# Patient Record
Sex: Female | Born: 1960 | Race: White | Hispanic: No | Marital: Married | Smoking: Never smoker
Health system: Southern US, Community
[De-identification: ages and names within clinical notes are randomized; demographics above are authoritative.]

## PROBLEM LIST (undated history)

## (undated) DIAGNOSIS — F419 Anxiety disorder, unspecified: Secondary | ICD-10-CM

## (undated) DIAGNOSIS — E669 Obesity, unspecified: Secondary | ICD-10-CM

## (undated) DIAGNOSIS — I10 Essential (primary) hypertension: Secondary | ICD-10-CM

## (undated) DIAGNOSIS — R0683 Snoring: Secondary | ICD-10-CM

## (undated) DIAGNOSIS — N200 Calculus of kidney: Secondary | ICD-10-CM

## (undated) DIAGNOSIS — I471 Supraventricular tachycardia, unspecified: Secondary | ICD-10-CM

## (undated) DIAGNOSIS — N390 Urinary tract infection, site not specified: Secondary | ICD-10-CM

## (undated) DIAGNOSIS — M797 Fibromyalgia: Secondary | ICD-10-CM

## (undated) DIAGNOSIS — F32A Depression, unspecified: Secondary | ICD-10-CM

## (undated) DIAGNOSIS — K219 Gastro-esophageal reflux disease without esophagitis: Secondary | ICD-10-CM

## (undated) DIAGNOSIS — E119 Type 2 diabetes mellitus without complications: Secondary | ICD-10-CM

## (undated) DIAGNOSIS — E785 Hyperlipidemia, unspecified: Secondary | ICD-10-CM

## (undated) DIAGNOSIS — D649 Anemia, unspecified: Secondary | ICD-10-CM

## (undated) DIAGNOSIS — F329 Major depressive disorder, single episode, unspecified: Secondary | ICD-10-CM

## (undated) HISTORY — DX: Obesity, unspecified: E66.9

## (undated) HISTORY — DX: Fibromyalgia: M79.7

## (undated) HISTORY — DX: Anxiety disorder, unspecified: F41.9

## (undated) HISTORY — DX: Supraventricular tachycardia, unspecified: I47.10

## (undated) HISTORY — DX: Hyperlipidemia, unspecified: E78.5

## (undated) HISTORY — DX: Snoring: R06.83

## (undated) HISTORY — DX: Essential (primary) hypertension: I10

## (undated) HISTORY — DX: Major depressive disorder, single episode, unspecified: F32.9

## (undated) HISTORY — DX: Type 2 diabetes mellitus without complications: E11.9

## (undated) HISTORY — PX: UMBILICAL HERNIA REPAIR: SHX196

## (undated) HISTORY — PX: CHOLECYSTECTOMY: SHX55

## (undated) HISTORY — DX: Calculus of kidney: N20.0

## (undated) HISTORY — DX: Anemia, unspecified: D64.9

## (undated) HISTORY — PX: MYOMECTOMY: SHX85

## (undated) HISTORY — DX: Urinary tract infection, site not specified: N39.0

## (undated) HISTORY — DX: Depression, unspecified: F32.A

## (undated) HISTORY — DX: Gastro-esophageal reflux disease without esophagitis: K21.9

## (undated) HISTORY — DX: Supraventricular tachycardia: I47.1

---

## 1998-06-02 ENCOUNTER — Ambulatory Visit (HOSPITAL_COMMUNITY): Admission: RE | Admit: 1998-06-02 | Discharge: 1998-06-02 | Payer: Self-pay | Admitting: Obstetrics and Gynecology

## 1998-07-31 ENCOUNTER — Encounter: Payer: Self-pay | Admitting: Obstetrics and Gynecology

## 1998-07-31 ENCOUNTER — Ambulatory Visit (HOSPITAL_COMMUNITY): Admission: RE | Admit: 1998-07-31 | Discharge: 1998-07-31 | Payer: Self-pay | Admitting: Obstetrics and Gynecology

## 1999-07-17 ENCOUNTER — Other Ambulatory Visit: Admission: RE | Admit: 1999-07-17 | Discharge: 1999-07-17 | Payer: Self-pay | Admitting: Obstetrics and Gynecology

## 1999-08-28 ENCOUNTER — Ambulatory Visit (HOSPITAL_COMMUNITY): Admission: RE | Admit: 1999-08-28 | Discharge: 1999-08-28 | Payer: Self-pay | Admitting: Obstetrics and Gynecology

## 1999-08-28 ENCOUNTER — Encounter (INDEPENDENT_AMBULATORY_CARE_PROVIDER_SITE_OTHER): Payer: Self-pay

## 2000-12-07 ENCOUNTER — Encounter: Payer: Self-pay | Admitting: Otolaryngology

## 2000-12-07 ENCOUNTER — Other Ambulatory Visit: Admission: RE | Admit: 2000-12-07 | Discharge: 2000-12-07 | Payer: Self-pay | Admitting: Obstetrics and Gynecology

## 2000-12-07 ENCOUNTER — Encounter: Admission: RE | Admit: 2000-12-07 | Discharge: 2000-12-07 | Payer: Self-pay | Admitting: Otolaryngology

## 2001-02-21 ENCOUNTER — Other Ambulatory Visit: Admission: RE | Admit: 2001-02-21 | Discharge: 2001-02-21 | Payer: Self-pay | Admitting: Otolaryngology

## 2001-02-21 ENCOUNTER — Encounter (INDEPENDENT_AMBULATORY_CARE_PROVIDER_SITE_OTHER): Payer: Self-pay | Admitting: Specialist

## 2002-03-07 ENCOUNTER — Other Ambulatory Visit: Admission: RE | Admit: 2002-03-07 | Discharge: 2002-03-07 | Payer: Self-pay | Admitting: Obstetrics and Gynecology

## 2003-03-11 ENCOUNTER — Other Ambulatory Visit: Admission: RE | Admit: 2003-03-11 | Discharge: 2003-03-11 | Payer: Self-pay | Admitting: Obstetrics and Gynecology

## 2003-05-18 ENCOUNTER — Emergency Department (HOSPITAL_COMMUNITY): Admission: EM | Admit: 2003-05-18 | Discharge: 2003-05-18 | Payer: Self-pay | Admitting: Emergency Medicine

## 2004-05-10 HISTORY — PX: BARIATRIC SURGERY: SHX1103

## 2004-05-14 ENCOUNTER — Other Ambulatory Visit: Admission: RE | Admit: 2004-05-14 | Discharge: 2004-05-14 | Payer: Self-pay | Admitting: Obstetrics and Gynecology

## 2004-10-19 ENCOUNTER — Encounter: Admission: RE | Admit: 2004-10-19 | Discharge: 2005-01-17 | Payer: Self-pay | Admitting: General Surgery

## 2004-10-22 ENCOUNTER — Ambulatory Visit (HOSPITAL_COMMUNITY): Admission: RE | Admit: 2004-10-22 | Discharge: 2004-10-22 | Payer: Self-pay | Admitting: General Surgery

## 2005-01-04 ENCOUNTER — Ambulatory Visit (HOSPITAL_COMMUNITY): Admission: RE | Admit: 2005-01-04 | Discharge: 2005-01-05 | Payer: Self-pay | Admitting: General Surgery

## 2005-02-12 ENCOUNTER — Encounter: Admission: RE | Admit: 2005-02-12 | Discharge: 2005-05-13 | Payer: Self-pay | Admitting: General Surgery

## 2005-06-30 ENCOUNTER — Other Ambulatory Visit: Admission: RE | Admit: 2005-06-30 | Discharge: 2005-06-30 | Payer: Self-pay | Admitting: Obstetrics and Gynecology

## 2005-08-03 ENCOUNTER — Encounter: Admission: RE | Admit: 2005-08-03 | Discharge: 2005-09-28 | Payer: Self-pay | Admitting: General Surgery

## 2006-01-05 ENCOUNTER — Encounter: Admission: RE | Admit: 2006-01-05 | Discharge: 2006-01-05 | Payer: Self-pay | Admitting: General Surgery

## 2006-05-10 HISTORY — PX: ABDOMINAL HYSTERECTOMY: SHX81

## 2006-07-07 ENCOUNTER — Encounter: Admission: RE | Admit: 2006-07-07 | Discharge: 2006-07-07 | Payer: Self-pay | Admitting: General Surgery

## 2006-09-06 ENCOUNTER — Encounter (INDEPENDENT_AMBULATORY_CARE_PROVIDER_SITE_OTHER): Payer: Self-pay | Admitting: Specialist

## 2006-09-06 ENCOUNTER — Inpatient Hospital Stay (HOSPITAL_COMMUNITY): Admission: RE | Admit: 2006-09-06 | Discharge: 2006-09-08 | Payer: Self-pay | Admitting: Obstetrics and Gynecology

## 2008-01-07 ENCOUNTER — Encounter (INDEPENDENT_AMBULATORY_CARE_PROVIDER_SITE_OTHER): Payer: Self-pay | Admitting: *Deleted

## 2008-01-07 ENCOUNTER — Inpatient Hospital Stay (HOSPITAL_COMMUNITY): Admission: EM | Admit: 2008-01-07 | Discharge: 2008-01-08 | Payer: Self-pay | Admitting: Interventional Cardiology

## 2008-01-18 ENCOUNTER — Ambulatory Visit (HOSPITAL_COMMUNITY): Admission: RE | Admit: 2008-01-18 | Discharge: 2008-01-18 | Payer: Self-pay | Admitting: Interventional Cardiology

## 2008-01-23 ENCOUNTER — Encounter (INDEPENDENT_AMBULATORY_CARE_PROVIDER_SITE_OTHER): Payer: Self-pay | Admitting: Interventional Cardiology

## 2008-01-23 ENCOUNTER — Ambulatory Visit (HOSPITAL_COMMUNITY): Admission: RE | Admit: 2008-01-23 | Discharge: 2008-01-23 | Payer: Self-pay | Admitting: Interventional Cardiology

## 2008-05-10 HISTORY — PX: OTHER SURGICAL HISTORY: SHX169

## 2008-07-07 ENCOUNTER — Inpatient Hospital Stay (HOSPITAL_COMMUNITY): Admission: EM | Admit: 2008-07-07 | Discharge: 2008-07-09 | Payer: Self-pay | Admitting: Emergency Medicine

## 2008-07-08 ENCOUNTER — Encounter (INDEPENDENT_AMBULATORY_CARE_PROVIDER_SITE_OTHER): Payer: Self-pay | Admitting: General Surgery

## 2010-01-02 IMAGING — RF DG ESOPHAGUS
11 of 15 series · 14 of 24 positions shown · IV contrast (agent unspecified)
Comparison: 07/07/2006

CLINICAL DATA: Chest pain

BARIUM SWALLOW / ESOPHAGRAM
TECHNIQUE: Combined double contrast and single contrast
examination performed using effervescent crystals, thick barium
liquid, and thin barium liquid.
Fluoroscopy Time: 3.5 minutes
Contrast: Thin and thick liquid barium forms and effervescent
crystals. 13 mm barium tablet

[Series 1: run · 1 of 3 slices shown (1 of 11)]
[im 1/3]
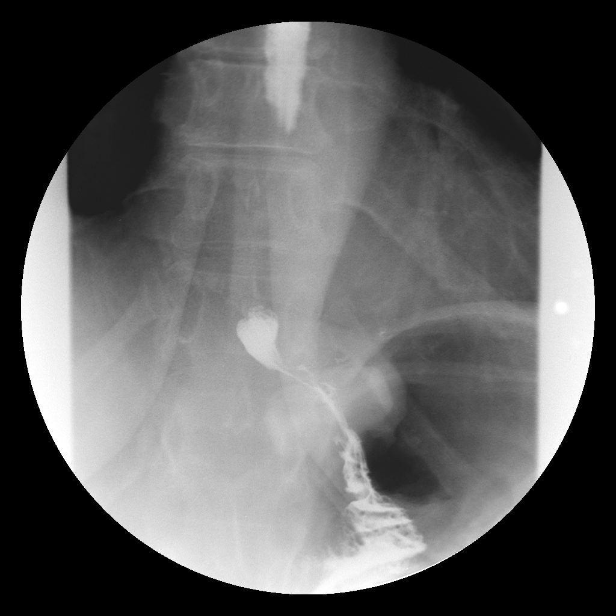

[Series 2: run · 1 of 3 slices shown (2 of 11)]
[im 1/3]
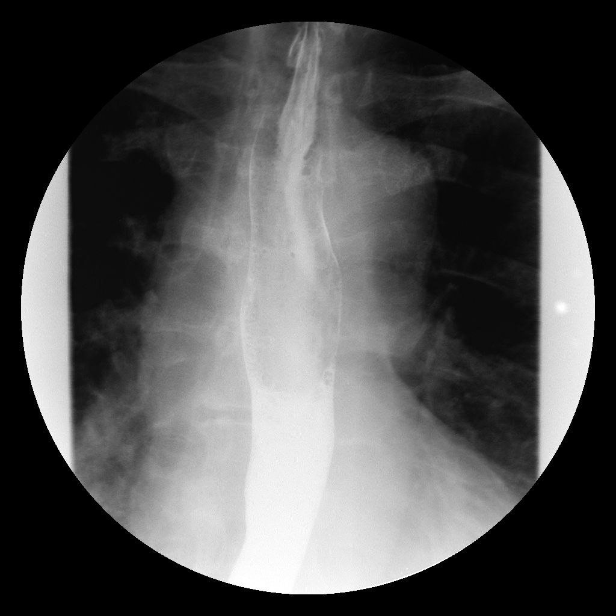

[Series 3: run · 1 of 3 slices shown (3 of 11)]
[im 1/3]
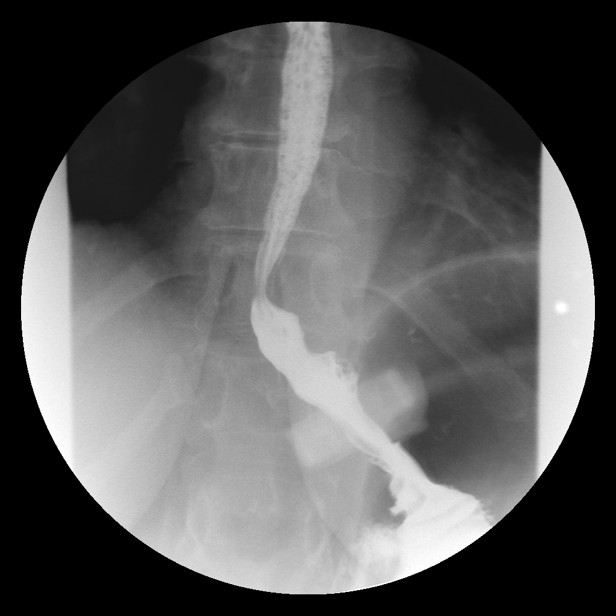

[Series 4: run · 1 of 2 slices shown (4 of 11)]
[im 1/2]
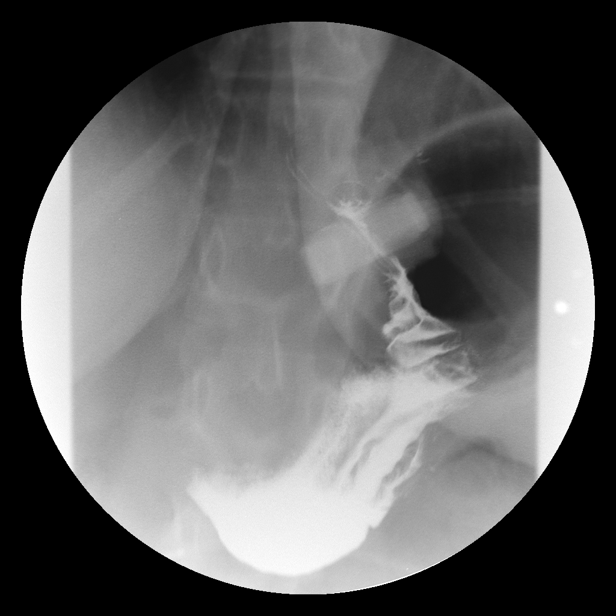

[Series 5: run · 1 of 2 slices shown (5 of 11)]
[im 1/2]
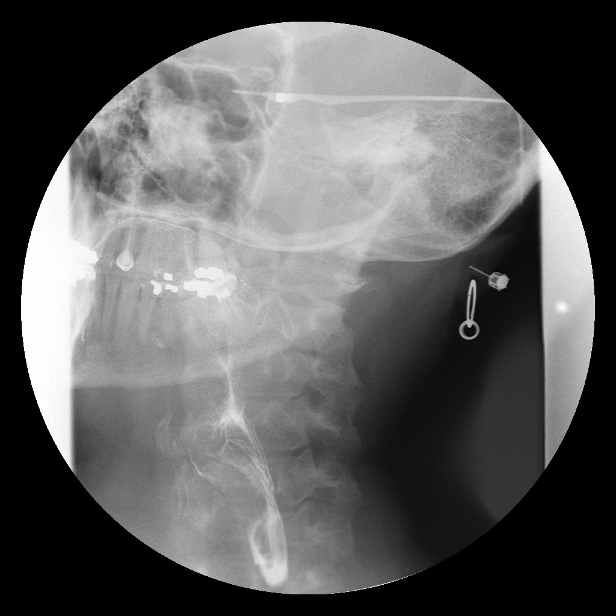

[Series 7: run · 4 of 11 slices shown (6 of 11)]
[im 1/11]
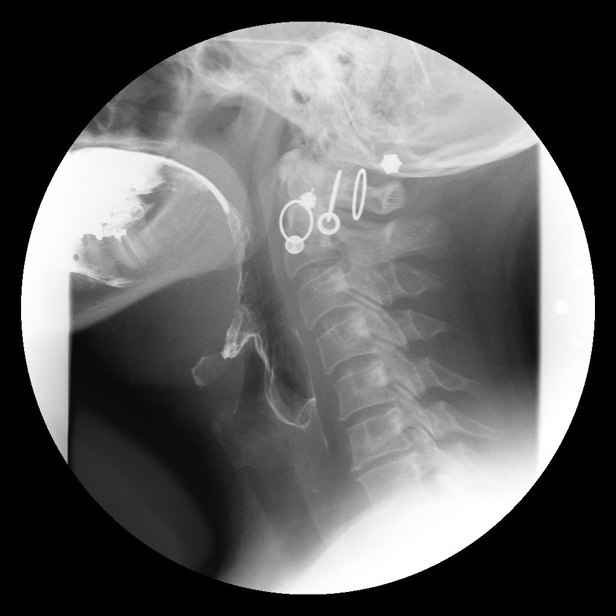
[im 5/11]
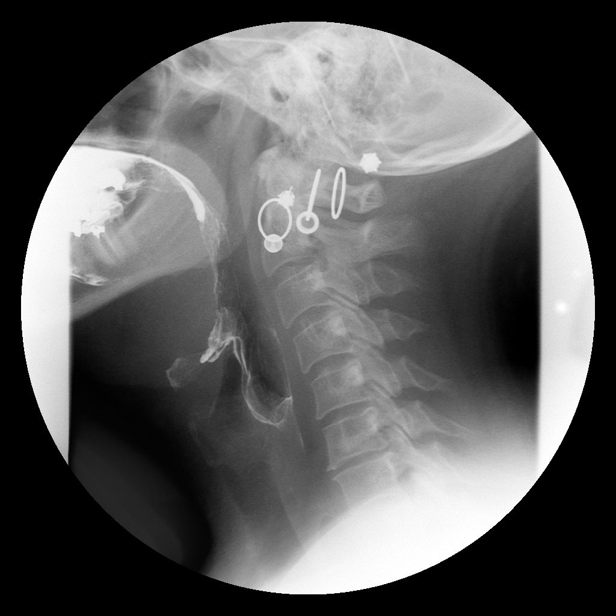
[im 7/11]
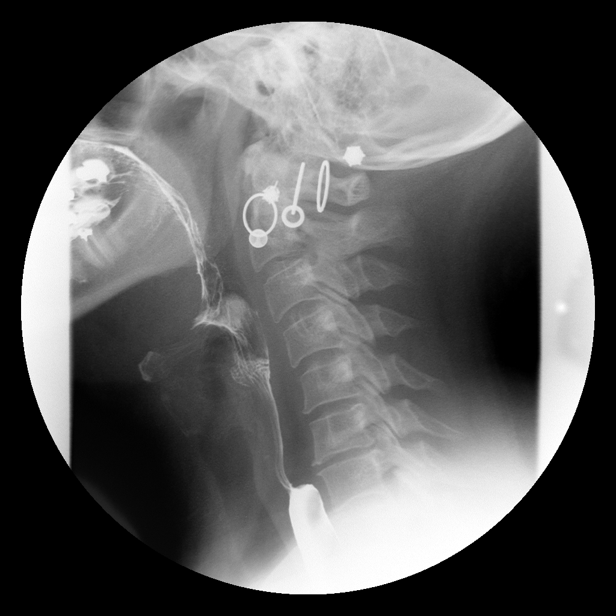
[im 11/11]
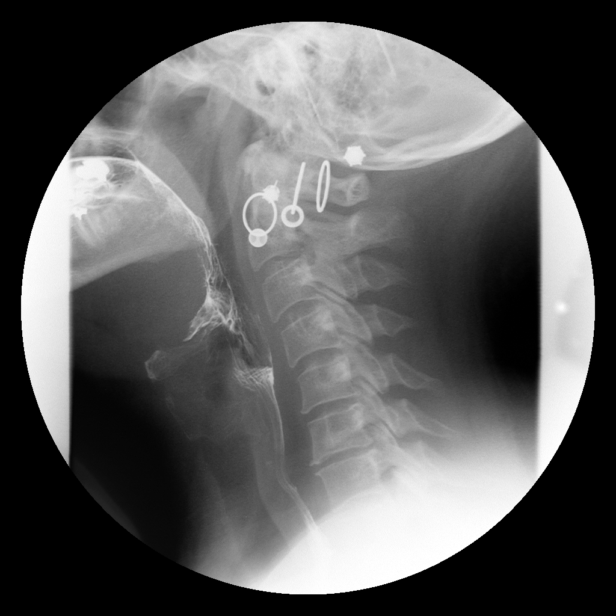

[Series 9: run · 1 of 3 slices shown (7 of 11)]
[im 1/3]
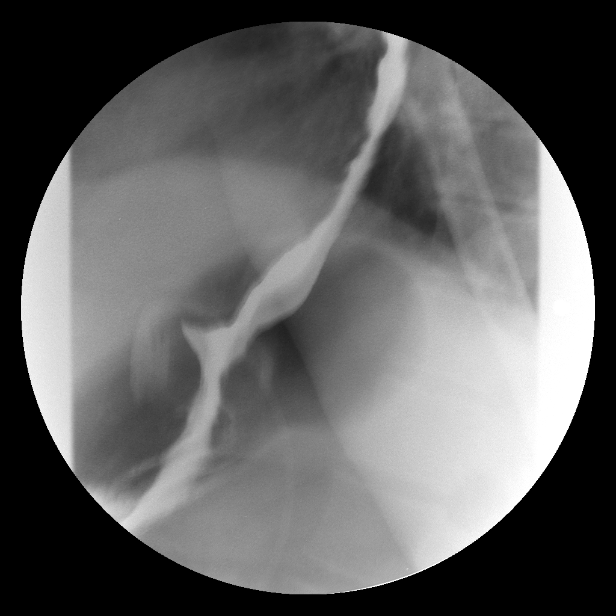

[Series 10: run · 1 of 2 slices shown (8 of 11)]
[im 1/2]
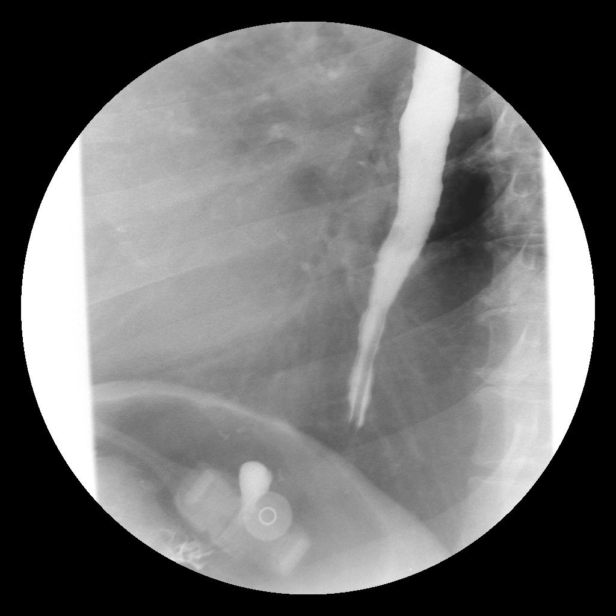

[Series 11: run · 1 of 2 slices shown (9 of 11)]
[im 1/2]
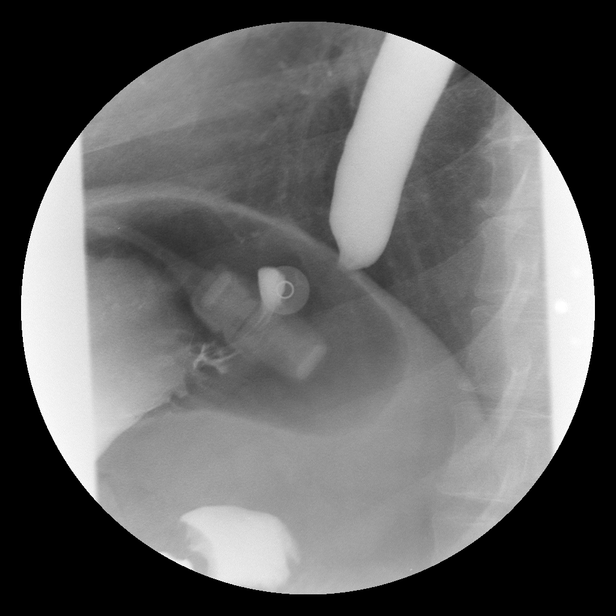

[Series 13: run · 1 of 2 slices shown (10 of 11)]
[im 1/2]
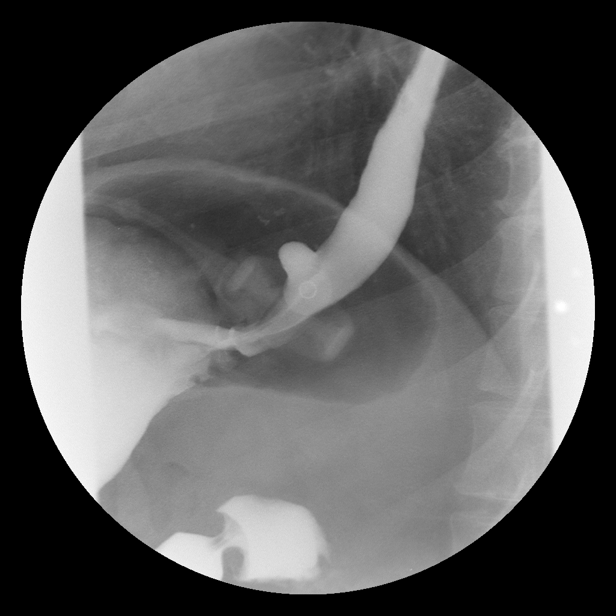

[Series 15: run · 1 of 2 slices shown (11 of 11)]
[im 1/2]
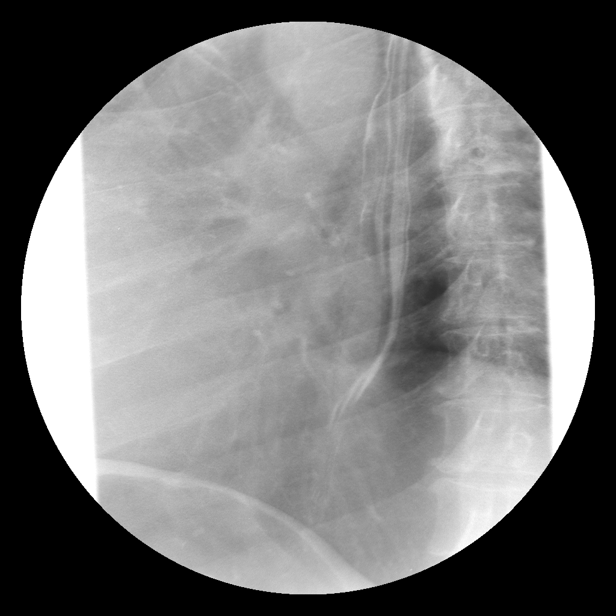

[14 of 24 positions shown; findings below may reference images not displayed]

FINDINGS: Swallowing function appeared normal.  No abnormal
retention of barium in the valleculae or piriform sinuses.  Primary
esophageal stripping wave is intact.  No tertiary contractions. No
findings to suggest esophageal spasm. No esophageal stricture.  The
patient swallowed a 13 mm barium tablet in the erect position.
Tablet readily traversed the esophagus and entered the stomach.
The gastric lap band appears in satisfactory position.  No
obstruction to the passage of barium across the lap band.  There is
a small diverticular like structure emanating off the distal
esophagus just proximal to the lap band.  This may represent a
small pulsion diverticulum, the clinical significance of which is
uncertain. It was not appreciated on the previous exam.
IMPRESSION: Unremarkable study with the exception of interval development of a
small diverticula like structure off the distal esophagus.

## 2010-07-04 ENCOUNTER — Observation Stay (HOSPITAL_COMMUNITY)
Admission: EM | Admit: 2010-07-04 | Discharge: 2010-07-05 | DRG: 254 | Disposition: A | Discharge status: Home / Self Care | Payer: BC Managed Care – PPO | Source: Other Acute Inpatient Hospital | Attending: Orthopedic Surgery | Admitting: Orthopedic Surgery

## 2010-07-04 DIAGNOSIS — S82109A Unspecified fracture of upper end of unspecified tibia, initial encounter for closed fracture: Principal | ICD-10-CM | POA: Insufficient documentation

## 2010-07-04 DIAGNOSIS — IMO0002 Reserved for concepts with insufficient information to code with codable children: Secondary | ICD-10-CM | POA: Insufficient documentation

## 2010-07-04 LAB — GLUCOSE, CAPILLARY: Glucose-Capillary: 163 mg/dL — ABNORMAL HIGH (ref 70–99)

## 2010-07-05 LAB — GLUCOSE, CAPILLARY: Glucose-Capillary: 152 mg/dL — ABNORMAL HIGH (ref 70–99)

## 2010-07-25 NOTE — Discharge Summary (Signed)
  NAME:  Holly Moss, Holly Moss                  ACCOUNT NO.:  [id number]  MEDICAL RECORD NO.:  [id number]           PATIENT TYPE:  I  LOCATION:  5009                         FACILITY:  [facility]  PHYSICIAN:  Almedia Balls. Ranell Patrick, M.D. DATE OF BIRTH:  10/13/1960  DATE OF ADMISSION:  07/04/2010 DATE OF DISCHARGE:  07/05/2010                              DISCHARGE SUMMARY   ADMISSION DIAGNOSIS:  Right knee pain secondary to tibial plateau fracture.  DISCHARGE DIAGNOSIS:  Right knee pain secondary to tibial plateau fracture.  BRIEF HISTORY:  The patient is a 50 year old female who was injured after she got struck on the knee by heavy lathe about a week ago.  She was seen in the emergency room on the 25th, kept for observation after an x-ray and CT scan showed tibial plateau fracture.  HOSPITAL COURSE:  The patient was admitted on July 04, 2010, after seen in the emergency room, diagnosed with tibial plateau fracture.  She was admitted for pain control, strict non-weightbearing and physical therapy.  The patient tolerated physical therapy quite well.  On postop day #1, pain was under control adequately, thus the patient to be discharged home on hospital day #1 after admission.  Her condition is stable.  ACTIVITY:  Nonweightbearing with crutches on the right lower extremity.  DISCHARGE MEDICATIONS: 1. Robaxin 500 mg p.o. q.6 hours. 2. Percocet 5/325 one-two tablets q.4-6 hours p.r.n. pain.  FOLLOWUP:  The patient is to follow back up with Dr. Malon Kindle in 1- 2 weeks.     Thomas B. Dixon, P.A.   ______________________________ Almedia Balls. Ranell Patrick, M.D.    TBD/MEDQ  D:  07/21/2010  T:  07/22/2010  Job:  [id number]  Electronically Signed by Standley Dakins P.A. on 07/22/2010 08:28:51 AM Electronically Signed by Malon Kindle  on 07/25/2010 11:22:14 AM

## 2010-08-20 LAB — GLUCOSE, CAPILLARY
Glucose-Capillary: 147 mg/dL — ABNORMAL HIGH (ref 70–99)
Glucose-Capillary: 160 mg/dL — ABNORMAL HIGH (ref 70–99)
Glucose-Capillary: 164 mg/dL — ABNORMAL HIGH (ref 70–99)
Glucose-Capillary: 168 mg/dL — ABNORMAL HIGH (ref 70–99)
Glucose-Capillary: 175 mg/dL — ABNORMAL HIGH (ref 70–99)
Glucose-Capillary: 176 mg/dL — ABNORMAL HIGH (ref 70–99)

## 2010-08-25 LAB — CBC
HCT: 40.9 % (ref 36.0–46.0)
Hemoglobin: 13.1 g/dL (ref 12.0–15.0)
MCV: 78.6 fL (ref 78.0–100.0)
Platelets: 290 10*3/uL (ref 150–400)
WBC: 7.5 10*3/uL (ref 4.0–10.5)

## 2010-08-25 LAB — GLUCOSE, CAPILLARY
Glucose-Capillary: 154 mg/dL — ABNORMAL HIGH (ref 70–99)
Glucose-Capillary: 197 mg/dL — ABNORMAL HIGH (ref 70–99)

## 2010-08-25 LAB — DIFFERENTIAL
Basophils Absolute: 0 10*3/uL (ref 0.0–0.1)
Lymphocytes Relative: 26 % (ref 12–46)
Monocytes Absolute: 0.6 10*3/uL (ref 0.1–1.0)
Neutro Abs: 4.6 10*3/uL (ref 1.7–7.7)
Neutrophils Relative %: 61 % (ref 43–77)

## 2010-08-25 LAB — URINALYSIS, ROUTINE W REFLEX MICROSCOPIC
Nitrite: NEGATIVE
Protein, ur: NEGATIVE mg/dL
Specific Gravity, Urine: 1.02 (ref 1.005–1.030)
Urobilinogen, UA: 0.2 mg/dL (ref 0.0–1.0)

## 2010-08-25 LAB — COMPREHENSIVE METABOLIC PANEL
Albumin: 3.2 g/dL — ABNORMAL LOW (ref 3.5–5.2)
BUN: 10 mg/dL (ref 6–23)
Chloride: 103 mEq/L (ref 96–112)
Creatinine, Ser: 0.62 mg/dL (ref 0.4–1.2)
GFR calc non Af Amer: >60 mL/min (ref >60)
Glucose, Bld: 194 mg/dL — ABNORMAL HIGH (ref 70–99)
Total Bilirubin: 0.7 mg/dL (ref 0.3–1.2)

## 2010-08-25 LAB — LIPASE, BLOOD: Lipase: 35 U/L (ref 11–59)

## 2010-09-22 NOTE — H&P (Signed)
NAME:  Holly Moss, Holly Moss                  ACCOUNT NO.:  [id number]   MEDICAL RECORD NO.:  [id number]          PATIENT TYPE:  INP   LOCATION:  0107                         FACILITY:  [facility]   PHYSICIAN:  Adolph Pollack, M.D.DATE OF BIRTH:  [birth year]   DATE OF ADMISSION:  07/07/2008  DATE OF DISCHARGE:                              HISTORY & PHYSICAL   REASON FOR ADMISSION:  Epigastric pain, gallstones.   HISTORY:  Holly Moss is a 50 year old female who reports that about a  week ago she was admitted to the hospital in [facility] with gallstone  pancreatitis.  She states she was there 7 days.  She saw Dr. Glenna Fellows 3 days ago and was scheduled for cholecystectomy July 16, 2008.  She awoke this morning with severe epigastric pain similar to the pain  she had last week which led her to go to the hospital in [facility].  She subsequently presented to the emergency department here.  While  here, she underwent a complete blood count which demonstrated a normal  white cell count with no left shift.  She underwent a complete metabolic  profile, and liver functions were all within normal limits except for a  slightly low albumin of 3.2.  Lipase was normal at 35.  She did undergo  an abdominal ultrasound which demonstrated cholelithiasis but no  gallbladder wall thickening, no pericholecystic fluid or no  ultrasonographic Murphy's sign.  The common bile duct was normal in  caliber.  Mild diffuse fatty infiltration of the liver was noted.  She  was given small doses of narcotics but could not quite get pain relief,  and so I was asked to see her and admit her.   PAST MEDICAL HISTORY:  1. Hypertension.  2. Hyperlipidemia.  3. Type 2 diabetes mellitus.  4. Obesity.  5. Supraventricular tachycardia.  6. Depression.  7. Anxiety.  8. Arthritis.  9. Cholelithiasis.   PREVIOUS OPERATIONS:  1. Uterine myomectomy.  2. Cesarean section.  3. Hysterectomy.  4. Laparoscopic gastric  banding.   ALLERGIES:  1. NUBAIN.  2. PENICILLIN.   MEDICATIONS:  1. Percocet.  2. Prozac.  3. Verapamil.  4. Nexium.  5. Wellbutrin.  6. Premarin.  7. Zyrtec.  8. Naprosyn.  9. Vytorin.   SOCIAL HISTORY:  She is married and works for [employer] in [place].  No tobacco or alcohol use.   FAMILY HISTORY:  Noncontributory.   REVIEW OF SYSTEMS:  No fever, no chills.  SKIN:  No jaundice.  CHEST:  No chest pain, only epigastric discomfort.  PULMONARY:  No dyspnea.  GI:  Has known reflux, but is not having any heartburn now.  No melena.  GU:  No dysuria or hematuria.   PHYSICAL EXAMINATION:  GENERAL:  An obese female.  She appears to be  moderately uncomfortable.  VITAL SIGNS:  Temperature is 97.4 on admission.  Blood pressure is  152/92, pulse 82, respiratory rate 18, O2 saturations 97% on room air.  HEENT:  Eyes - wears glasses.  Extraocular motions intact.  No icterus.  NECK:  Supple without masses.  RESPIRATORY:  Breath sounds equal and clear.  Respirations are  unlabored.  CARDIOVASCULAR:  Heart demonstrates a regular rate, regular rhythm.  I  do not hear a murmur.  There is no lower extremity edema.  ABDOMEN:  Soft.  There is a lower transverse scar present.  A small  subumbilical scar is present.  A larger right mid abdominal scar is  noted, and well as very small other upper abdominal scars are noted.  EXTREMITIES:  Good range of motion.  SKIN:  No jaundice.   IMPRESSION:  Epigastric pain due to biliary colic from her  cholelithiasis.  Does not appear to have acute cholecystitis or  pancreatitis at this time.  However, is having difficulty being  comfortable from this biliary colic attack.  There could be a potential  chance for her having evolving acute cholecystitis.   PLAN:  Will admit to the hospital for pain control and empirically place  on antibiotics.  We discussed her having a laparoscopic, possible open,  cholecystectomy this admission.  I have  discussed the procedure and  risks with her.  Risks include, but are not limited to, bleeding,  infection, wound healing problems, anesthesia, diarrhea, bile leak,  accidental injury to the common bile duct/liver/intestine.  She seems to  understand this and agrees with the plan.      Adolph Pollack, M.D.  Electronically Signed     TJR/MEDQ  D:  07/07/2008  T:  07/07/2008  Job:  [id number]

## 2010-09-22 NOTE — Discharge Summary (Signed)
NAMEGLORIOUS, Holly Moss NO.:  [id number]   MEDICAL RECORD NO.:  [id number]          PATIENT TYPE:  INP   LOCATION:  2036                         FACILITY:  [facility]   PHYSICIAN:  Lyn Records, M.D.   DATE OF BIRTH:  09-30-1960   DATE OF ADMISSION:  01/07/2008  DATE OF DISCHARGE:  01/08/2008                               DISCHARGE SUMMARY   DISCHARGE DIAGNOSES:  1. Palpitations, resolved.  2. Chest pain with supraventricular tachycardia, resolved.  3. Elevated glucose, checking hemoglobin A1c.  4. Upper respiratory infection, treated with Zithromax.  5. Dyslipidemia.  6. Postmenopausal.  7. Long-term medication use.   HOSPITAL COURSE:  [name] is a 50 year old female with a past medical  history of SVT, diet-controlled diabetes, hypertension, and dyslipidemia  who presented initially to [facility] with palpitations/chest  pain.  She was then transferred to [facility].   She reports a cold for about 5 days and has been taking Tylenol Cold as  well as Neo-Synephrine.  She has increased these doses recently and  stopped her Zyrtec which she felt was not helping her.  She states that  she probably was taking too much of the Tylenol for this as well as the  Neo-Synephrine.  She developed SVT.  She felt that it was from these  medications that she developed SVT.   With the SVT, she did have some palpitations and then started having  chest pain, that is when she presented to the emergency room.   Lab studies showed hemoglobin of 13.2, hematocrit 39.8, platelets 191,  and white count 4.0.  BUN 6, creatinine 0.60, glucose 176, AST 213, ALT  85, alk phos 175, TSH 0.81, and cardiac isoenzymes negative x2.   She remained in the hospital overnight and had one isolated episode of  SVT.  She felt great.  She denied any chest pain, denied any  palpitations.  Her glucose was elevated at 176, we did check a  hemoglobin A1c before her discharge.  This will need to be  reviewed at  her visit.  Her LFTs were somewhat elevated during this admission and I  am going to recheck them as an outpatient.  These need to be compared to  her previous lab studies.   DISCHARGE MEDICATIONS:  1. Premarin 0.625 mg a day.  2. Naproxen two a day.  3. Zyrtec one a day.  4. Wellbutrin XL 150 mg 3 tablets daily.  5. Vytorin 1 tablet a day.  6. Verapamil 360 mg a day.  7. Prozac 60 mg a day.  8. Micardis 20 mg a day.  9. Flexeril p.r.n.  10.Ativan p.r.n.  11.Toprol-XL 50 mg 1 tablet daily.  12.She is to stop her Tylenol Cold and Neo-Synephrine.   She is to hold the Toprol and verapamil 24 hours before her treadmill  test.  Her exercise treadmill and echocardiogram will be on January 17, 2008 at 9:00 a.m.  She is to remain on a low-sodium heart-healthy  diabetic diet.  She may shower, bathe, and increase activity slowly.  Guy Franco, P.A.      Lyn Records, M.D.  Electronically Signed    LB/MEDQ  D:  01/08/2008  T:  01/08/2008  Job:  [id number]

## 2010-09-22 NOTE — H&P (Signed)
Holly Moss, PERCIFIELD NO.:  [id number]   MEDICAL RECORD NO.:  [id number]          PATIENT TYPE:  INP   LOCATION:  2036                         FACILITY:  [facility]   PHYSICIAN:  Elmore Guise., M.D.DATE OF BIRTH:  05/21/1960   DATE OF ADMISSION:  01/07/2008  DATE OF DISCHARGE:                              HISTORY & PHYSICAL   INDICATION FOR ADMISSION:  SVT and chest pain.   PRIMARY CARDIOLOGIST:  Garnette Scheuermann, MD, [facility].   HISTORY OF PRESENT ILLNESS:  Ms. Holly Moss is a very pleasant 50 year old  white female with past medical history of SVT, diet-controlled diabetes  mellitus, hypertension, dyslipidemia, obesity (status post gastric lap  banding surgery 3 years ago) who presented to the outside hospital with  palpitations and chest pain.  The patient reports that she has been  having a cold for the last 5 days.  She has been having increasing  postnasal drip, sore throat, cough but no fever.  She started taking  increasing doses of Tylenol Sinus.  Today, patient had an acute onset of  palpitations starting approximately 9:30 a.m.  Her symptoms continued,  progressed to shortness of breath, diaphoresis, dry heaves, she also  reports chest discomfort like somebody poking me with a hot iron.  This continued until she went to the local hospital, there she was found  to be in SVT with heart rates in the 140-150 range, blood pressure in  the 90-100 range, she was given IV verapamil with significant  improvement; because of continued chest pain, she was also given  morphine with resolution.  Her initial enzymes were negative.  Because  her symptoms, she was transferred for further evaluation.  The patient  does stay very active, she denies any significant problems other than  this head cold.  She did report over the last week she has had  difficulty swallowing her pills.  She thinks that something may be  wrong with my gastric band.  She has also had some dry  heaves, mid  epigastric pain, no diarrhea, no vomiting, no lower extremity edema, no  orthopnea or PND; review of systems are otherwise negative.   CURRENT MEDICATIONS:  1. Premarin 0.65 mg daily.  2. Naprosyn 220 mg 2 tablets daily.  3. Nexium 40 mg daily.  4. Zyrtec 10 mg daily.  5. Wellbutrin 150 mg 3 tablets daily.  6. Vytorin 10/40 mg daily.  7. Verapamil 120 mg 3 tablets daily.  8. Prozac 60 mg daily.  9. Micardis 20 mg daily.   ALLERGIES:  1. PENICILLIN.  2. NUBAIN.   FAMILY HISTORY:  Positive for dyslipidemia, heart failure, cancer and  diabetes.   SOCIAL HISTORY:  She is married, denies any tobacco or alcohol.  She  does work as a Engineer, civil (consulting) at [employer].   EXAM:  She is afebrile.  Blood pressure has been ranging from 90-120/40-  70.  Heart rate is 90, initially it was 140-150, she is sating 99% on  room air.  GENERAL:  She is a very pleasant white female, alert and oriented x4, in  no acute distress.  NECK:  Supple, with mild submandibular lymphadenopathy which is tender.  Her posterior pharynx has mild reddening and tonsillar hypertrophy but  no exudate.  She has no JVD and no bruits.  LUNGS:  Clear.  HEART:  Regular with normal S1-S2.  ABDOMEN:  Soft, nontender, nondistended, no rebound or guarding.  EXTREMITIES:  Warm with 2+ pulses and no edema.  Her blood work shows a  CPK of 44, an MB of 0.3, troponin I 0.00, BUN and creatinine are 11 and  0.9, potassium level is 4.1, her coags are normal at 11, 1.0 and 22.5.  Her CBC shows a white count of 7.4, hemoglobin of 14.1, platelet count  196 with 3% bandemia noted.  Her ECG #1 showed SVT with lateral ST  depressions noted, with ST depression of 1-2 mm.  ECG #2, after  verapamil, showed normal sinus rhythm 90 per minute with nonspecific ST-  T wave changes.   IMPRESSION:  1. Supraventricular tachycardia likely exacerbated by recent cold      medication usage.  2. Chest pain, atypical, with history of gastric  banding; we will      check a barium swallow for further evaluation.  3. Upper respiratory tract infection.  4. Dysphasia and abdominal pain.  5. Diabetes mellitus.  6. Hypertension.   PLAN:  The patient will be admitted to telemetry monitoring, we will add  low-dose Toprol to her regimen.  She will be ruled out for myocardial  infarction.  I plan to start azithromycin for her URI.  We will check a  barium swallow for her dysphasia as well as abdominal pain.  She may  need abdominal CT if her symptoms continue.  I will also check LFTs and  TSH.  She will have an echocardiogram done in the morning.  Currently,  she is resting very comfortably with heart rate in the 80 range.  Further measures per Dr. Garnette Scheuermann.      Elmore Guise., M.D.  Electronically Signed     TWK/MEDQ  D:  01/07/2008  T:  01/07/2008  Job:  [id number]

## 2010-09-22 NOTE — Op Note (Signed)
NAME:  Holly, Moss                  ACCOUNT NO.:  [id number]   MEDICAL RECORD NO.:  [id number]          PATIENT TYPE:  INP   LOCATION:                               FACILITY[facility]  [facility]   PHYSICIAN:  Sharlet Salina T. Hoxworth, M.D.DATE OF BIRTH:  20-Sep-1960   DATE OF PROCEDURE:  07/08/2008  DATE OF DISCHARGE:  07/09/2008                               OPERATIVE REPORT   POSTOPERATIVE DIAGNOSES:  Cholelithiasis and acute cholecystitis.   POSTOPERATIVE DIAGNOSES:  Cholelithiasis and acute cholecystitis.   SURGICAL PROCEDURE:  Laparoscopic cholecystectomy with intraoperative  cholangiogram.   SURGEON:  Dr. Johna Sheriff.   ASSISTANT:  Dr. Ardeth Sportsman.   ANESTHESIA:  General.   BRIEF HISTORY:  Holly Moss is a 50 year old female who recently was  hospitalized with an episode of acute gallstone pancreatitis in [facility] and discharged.  She has had a lap band and has had moderate weight  loss.  I have recommended proceeding with elective cholecystectomy and  cholangiogram.  She, however, presented again yesterday with acute  persistent epigastric and right upper quadrant abdominal pain consistent  with cholecystitis.  We have recommended proceeding with urgent  laparoscopic cholecystectomy with cholangiogram.  LFTs and lipase are  now normal.  The nature of the procedure, its indications, risks of  bleeding, infection, bile leak, bile duct injury and possible infection  of her lap band system have been discussed and understood.  She is now  brought to the operating room for this procedure.   DESCRIPTION OF PROCEDURE:  The patient was brought to the operating room  and placed in the supine position on the operating table and general  endotracheal anesthesia was induced.  She was already on broad-spectrum  antibiotics.  The abdomen was widely sterilely prepped and draped.  PAS  were in place.  Correct patient and procedure were verified.  I made a 1-  1/2 cm incision in the midline above the  umbilicus.  Dissection was  carried down to the midline fascia which was sharply incised for 1-cm  and the peritoneum entered under direct vision.  Through a mattress  suture of 0 Vicryl, the Hassan trocar was placed and pneumoperitoneum  established.  Under direct vision avoiding the lap band port and tubing,  two 5-mm trocars were placed in the right upper quadrant laterally and  an 11-mm trocar in the subxiphoid area.  The gallbladder was tensely  distended and acutely inflamed.  On elevating the fundus it did rupture  and was decompressed with suction.  There were some acute and chronic  fibrofatty adhesions up to the gallbladder, fairly extensive along the  infundibulum which were carefully taken down with cautery and a couple  of vessels also clipped.  As we reached the infundibulum, it was  retracted inferolaterally and further fibrofatty tissue was tediously  stripped off the neck of the gallbladder toward the porta hepatis.  The  dissection was difficult, but progressed smoothly and the distal  gallbladder was dissected and the cystic artery identified at Calot's  triangle.  The cystic duct gallbladder junction dissected 360 degrees.  When the anatomy appeared clear, the cystic duct was clipped at the  gallbladder junction and an operative cholangiogram obtained through the  cystic duct.  This showed good filling of normal common bile duct and  intrahepatic ducts with free flow into the duodenum and no filling  defects.  Following this, the cholangiocath was removed and the cystic  duct was doubly clipped proximally and divided and the cystic artery  further clipped and divided.  The gallbladder was then dissected free  from its bed using hook cautery, placed in an EndoCatch bag and removed  through the umbilical port site.  The right upper quadrant and abdomen  were then copiously irrigated with several liters of saline.  Two small  stones were removed from Morison's pouch.  We  did not see any other  evidence of retained stones and irrigation was clear at the end.  Complete hemostasis was assured in the gallbladder bed.  At this point,  all trocars were removed and CO2 was evacuated.  A mattress suture was  secured in the midline.  The skin incisions were closed with  subcuticular Monocryl and Dermabond.  Sponge, needle and instrument  counts were correct.  The patient was taken to recovery in good  condition.      Lorne Skeens. Hoxworth, M.D.  Electronically Signed     BTH/MEDQ  D:  07/08/2008  T:  07/08/2008  Job:  [id number]

## 2010-09-25 NOTE — Op Note (Signed)
NAME:  WYATT, GALVAN                  ACCOUNT NO.:  [id number]   MEDICAL RECORD NO.:  [id number]          PATIENT TYPE:  AMB   LOCATION:  DAY                          FACILITY:  [facility]   PHYSICIAN:  Sharlet Salina T. Hoxworth, M.D.DATE OF BIRTH:  Apr 12, 1961   DATE OF PROCEDURE:  01/04/2005  DATE OF DISCHARGE:                                 OPERATIVE REPORT   PREOPERATIVE DIAGNOSIS:  Morbid obesity.   POSTOPERATIVE DIAGNOSIS:  Morbid obesity.   SURGICAL PROCEDURE:  Placement of a laparoscopic adjustable gastric band.   SURGEON:  Dr. Johna Sheriff   ASSISTANT:  Dr. Danna Hefty.   ANESTHESIA:  General.   BRIEF HISTORY:  Rache Klimaszewski is a 50 year old female with morbid obesity  unresponsive to medical management and multiple comorbidities.  Following a  detailed preoperative assessment and discussion detailed elsewhere, we have  elected to proceed with laparoscopic adjustable gastric band placement.  The  nature of the procedure, indications, and risks have been discussed  extensively, again detailed elsewhere.  She is now brought to the operating  room for this procedure.   DESCRIPTION OF OPERATION:  The patient brought to the operating room, placed  in supine position on the operating table, and general endotracheal  anesthesia was induced.  She received preoperative antibiotics.  PAS were in  place.  Subcutaneous heparin was given.  The abdomen was widely sterilely  prepped and draped.  Correct patient and procedure were verified.  Access  was obtained with an 11 mm OptiVu trocar in the left subcostal space without  difficulty and pneumoperitoneum established.  Under direct vision, a 10 mm  trocar was placed in the right subxiphoid area near the falciform ligament,  a 12 mm trocar in the right and mid upper abdomen, an 11 mm trocar in the  left upper mid abdomen, and a 5 mm trocar in the left flank.  Through a 5-mm  site the [facility] retractor was placed in the subxiphoid area.  The  liver  was long and somewhat floppy but was able to be adequately retracted with  the [facility] retractor.  The hiatus was exposed.  There was quite a bit of  fat around the EG junction.  Peritoneum was incised over the left crus and  with the finger dissector, dissection was carried back down posteriorly  along the left crus toward the lesser sac.  Due to a large esophageal fat  pad, we elected to remove this for better band fit.  Using the harmonic  scalpel carefully protecting the stomach and EG junction, the anterior fat  pad was excised and removed.  Hemostasis was assured.  Following this, the  pars flaccida was divided with blunt dissection and the right crus  identified, and it was traced inferiorly down to the crossing fat, and at  this site just at the medial edge of the crus the retroperitoneum was  entered with easy blunt dissection.  The finger tractor was then placed into  this space and passed posterior to the stomach with no resistance.  The  finger was then deployed, and it came up  nicely in the previously dissected  area at the angle of His, and the tip was freed at this area.  Following  this, a 10 mL lap band system was introduced through one of the trocar  sites.  The tubing was placed through the finger retractor which then was  withdrawn and the tubing and band brought around posterior to the stomach  without difficulty.  The tubing was placed through the buckle.  The  calibration tube was passed orally into the stomach without difficulty.  The  band was then locked into place under no tightness with the calibration tube  in place, and the calibration tube was removed without any hang-up.  Following this, retracting the tubing toward the patient's feet exposing the  small pouch anteriorly, the fundus was imbricated up over the band to the  pouch with three interrupted 3-0 Vicryl sutures.  The band rotated without  difficulty following this.  At this point, the  [facility] retractor was  removed, the tubing brought out through the right and mid abdominal port  site, all CO2 evacuated and trocars removed.  This incision was extended a  couple of centimeters laterally, the anterior fascia exposed.  The tubing  was trimmed, the port attached, and the port sutured to the anterior fascia  with four interrupted 2-0 Prolene sutures.  The port lay just at the lateral  end of the incision.  Incision was irrigated.  The port site incision was  closed with subcutaneous 3-0 Vicryl and then all skin incisions closed with  staples.  Sponge, needle, and instrument counts were correct.  Dry sterile  dressings were applied and the patient taken to the recovery room in good  condition.      Lorne Skeens. Hoxworth, M.D.  Electronically Signed     BTH/MEDQ  D:  01/04/2005  T:  01/04/2005  Job:  [id number]

## 2010-09-25 NOTE — Op Note (Signed)
[facility]  Patient:    Holly Moss, Holly Moss                         MRN: [id number] Proc. Date: 08/28/99 Adm. Date:  [id number] Attending:  Cordelia Pen Ii                           Operative Report  PREOPERATIVE DIAGNOSIS:       Missed abortion.  POSTOPERATIVE DIAGNOSIS:      Missed abortion.  PROCEDURE:                    Dilatation and evacuation.  SURGEON:                      Guy Sandifer. Arleta Creek, M.D.  ANESTHESIA:                   MAC with 1% Xylocaine paracervical block-Franklin  Henschen, M.D.  ESTIMATED BLOOD LOSS:         Less than 50 cc.  INDICATIONS AND CONSENT:      This patient is a 50 year old married white female G3, P1, AB1 at approximately 10 weeks estimated gestational age with missed abortion.  Details are dictated in the history and physical.  Dilatation and evacuation as well as risks and complications have been discussed with the patient and her husband.  All questions answered.  PROCEDURE:                    The patient was taken to the operating room and placed in the dorsal supine position where MAC is administered.  She is then placed in the dorsal lithotomy position where she is gently prepped and draped. Patient had recently voided.  Examination reveals the cervix to be closed, thick, and high and the uterus to be 8-10 weeks in size.  Vivelle speculum was placed in the vagina and the anterior cervical lip is injected with 1% Xylocaine and then grasped with a single tooth tenaculum.  Paracervical block is then placed at the 2, 4, 5, 7, 8, and 10 oclock positions with 1% Xylocaine.  Cervix is then gently progressively  dilated to a 31 Pratt dilator.  A # 8 suction curette is then passed and suction curettage is carried out for obvious products of conception.  Alternating sharp and suction curettage is carried out until the cavity feels clean.  Ten units of Pitocin are added to the remaining 500 cc of IV  fluids after the initial pass of the suction curette.  Twenty units of Pitocin will be added to the next liter of IV fluids.  Good hemostasis is noted.  All instruments are removed.  All counts are correct.  The patient is awakened, taken to the recovery room in stable condition. Patients blood type is O+.  Patient is given Methergine 0.2 mg p.o. t.i.d. for wo days.  She is given Ancef 1 g IV during the case.  Follow-up is in two weeks. DD:  08/28/99 TD:  08/30/99 Job: [id number]

## 2010-09-25 NOTE — H&P (Signed)
NAME:  Holly Moss, Holly Moss                  ACCOUNT NO.:  [id number]   MEDICAL RECORD NO.:  [id number]          PATIENT TYPE:  AMB   LOCATION:  SDC                           FACILITY:  [facility]   PHYSICIAN:  Guy Sandifer. Henderson Cloud, M.D. DATE OF BIRTH:  November 17, 1960   DATE OF ADMISSION:  09/06/2006  DATE OF DISCHARGE:                              HISTORY & PHYSICAL   CHIEF COMPLAINT:  Heavy menses and stress incontinence.   HISTORY OF PRESENT ILLNESS:  This patient is a 50 year old married white  female, G3, P1, husband status post vasectomy.  Patient has had  myomectomy x2 as well as laparoscopy with lysis of dense adhesions.  She  has increasingly heavy menstrual flow, sometimes lasting three weeks at  a time.  She also leaks urine with coughing and sneezing.  She is having  generalized discomfort and pressure in her pelvis as well.  Urodynamics  in the office were consistent with stress urinary incontinence.  Ultrasound on July 14, 2006 reveals the uterus measuring 10.5 x 6.8 x  7.5 cm.  Multiple leiomyomata are noted.  The ovaries have normal  follicle cysts.  After careful discussion of options, she is being  admitted for total abdominal hysterectomy with bilateral salpingo-  oophorectomy and transobturator mid urethral sling.  Issues of menopause  as well as risks and benefits of the above surgeries have been reviewed  preoperatively.   PAST MEDICAL HISTORY:  1. Chronic hypertension.  2. History of diabetes prior to her lap banding.  3. Herpes.  4. Depression.   PAST SURGICAL HISTORY:  1. Uterine myomectomy x2.  2. Laparoscopy with hysteroscopy D&C in 2000.  3. Laparoscopic gastric banding, Dr. Johna Sheriff.   MEDICATIONS:  Prilosec, Wellbutrin, Prozac, verapamil, Valtrex, Zyrtec,  and vitamins.   ALLERGIES:  PENICILLIN, leading to rash (CEPHALOSPORINS are okay).  NUBAIN, leading to nausea and vomiting.   OBSTETRICAL HISTORY:  Cesarean section x1.  Miscarriage x2.   FAMILY HISTORY:  Chronic  hypertension in father.  Diabetes in maternal  grandmother and maternal uncle and father.  Gallbladder disease in  mother.  Cancer in maternal grandmother and maternal aunt.  Uterine  cancer in paternal grandmother.  Depression in mother.   SOCIAL HISTORY:  She denies tobacco, alcohol, or drug abuse.   REVIEW OF SYSTEMS:  NEURO:  Denies headache.  CARDIAC:  Denies chest  pain.  PULMONARY:  Denies shortness of breath.  GI:  Denies recent  changes in bowel habits.   PHYSICAL EXAMINATION:  VITAL SIGNS:  Height 5 feet 4-1/4 inches.  Weight  194 pounds.  Blood pressure 126/90.  HEENT:  Without thyromegaly.  LUNGS:  Clear to auscultation.  HEART:  Regular rate and rhythm.  BACK:  Without CVA tenderness.  BREASTS:  Without mass, rash, or discharge.  ABDOMEN:  Obese, soft and nontender without masses.  PELVIC:  Vulva, vagina, and cervix without lesion.  Uterus is 14 weeks  size, mildly tender, immobile.  Adnexa nontender without palpable  masses.  EXTREMITIES:  Grossly within normal limits.  NEUROLOGIC:  Grossly within normal limits.   ASSESSMENT:  1. Uterine fibroids.  2. Stress urinary incontinence.   PLAN:  Total abdominal hysterectomy with bilateral salpingo-oophorectomy  and transobturator mid urethral sling.      Guy Sandifer Henderson Cloud, M.D.  Electronically Signed     JET/MEDQ  D:  09/01/2006  T:  09/01/2006  Job:  [id number]

## 2010-09-25 NOTE — Op Note (Signed)
NAME:  Holly Moss, Holly Moss                  ACCOUNT NO.:  [id number]   MEDICAL RECORD NO.:  [id number]          PATIENT TYPE:  AMB   LOCATION:  SDC                           FACILITY:  [facility]   PHYSICIAN:  Guy Sandifer. Henderson Cloud, M.D. DATE OF BIRTH:  Dec 07, 1960   DATE OF PROCEDURE:  09/06/2006  DATE OF DISCHARGE:                               OPERATIVE REPORT   PREOPERATIVE DIAGNOSIS:  1. Uterine leiomyomata.  2. Stress urinary incontinence.   POSTOPERATIVE DIAGNOSIS:  1. Uterine leiomyomata.  2. Stress urinary incontinence.   PROCEDURE:  Total abdominal hysterectomy with bilateral salpingo-  oophorectomy, transabdominal tape midurethral sling, biopsy of left  vulva.   SURGEON:  Guy Sandifer. Henderson Cloud, M.D.   ASSISTANTStann Mainland. Vincente Poli, M.D.   ANESTHESIA:  General endotracheal intubation.   SPECIMENS:  Uterus, bilateral tubes and ovaries, and biopsy of left  vulvar the pathology.   ESTIMATED BLOOD LOSS:  150 mL.   INDICATIONS AND CONSENT:  The patient is a 50 year old married white  female G3, P1, husband status post vasectomy with increasingly  symptomatic uterine leiomyomata and leaking of urine.  Details dictated  history and physical.  Options of BSO versus ovarian preservation are  thoroughly discussed preoperatively.  The patient desires removal of  both ovaries.  Total abdominal hysterectomy with bilateral salpingo-  oophorectomy and mid urethral sling are discussed preoperatively.  Potential risks and complications were discussed including but not  limited to infection, bowel, bladder, ureteral damage, bleeding  requiring transfusion of blood products with possible transfusion  reaction, HIV and hepatitis acquisition, DVT, PE, pneumonia, fistula  formation, postoperative dyspareunia, success and failure rate of the  sling, vaginal erosion, vaginal dyspareunia, possible need for prolonged  catheterization, or return to the OR.  All questions were answered and  consent signed on the  chart.   FINDINGS:  The uterus is 60 weeks in size.  There are several adhesions  of omentum to the uterine fundus as well as to the right tube and ovary.  The left ovary is adherent to the pelvic sidewall.   PROCEDURE:  The patient is taken to operating room. She is identified,  placed in dorsosupine position.  Anesthesia is induced with endotracheal  intubation.  She is then placed in the dorsal lithotomy position where  she is prepped abdominally and vaginally, Foley catheter is placed.  The  bladder was drained and she is draped in stable fashion.  Pfannenstiel  incision was made through the previous scar and dissection is carried  out layers to the peritoneum.  Peritoneum is entered and extended  superiorly and inferiorly.  O'Sullivan-O'Connor retractor is placed,  bladder blade is placed.  Bowels packed away and the upper blade is  placed as well.  The adhesions of the uterus are taken down sharply.  The proximal ligaments are grasped bilaterally with Kelly clamps to  elevate the uterus.  The left round ligament is then grasped and  transected and ligated with 0 Monocryl suture.  All suture will be 0  Monocryl unless otherwise designated.  Anterior leaf of broad ligament  is taken down.  The course the ureter is identified.  Posterior leaf of  broad ligament is perforated as well.  The left infundibulopelvic  ligament is crossclamped, taken down and doubly ligated with free tie  and suture.  The bladder was advanced sharply and bluntly.  The uterine  vessels are taken down on the left side and singly ligated.  Similar  procedure is then carried out on the right.  After advancing the bladder  adequately sharply and bluntly.  The cardinal ligaments are taken down  bilaterally.  The vagina is then entered bilaterally with curved Heaney  clamps.  The specimen is then cut totally free with the Satinsky  scissors.  Inspection reveals the cervix to be removed in total.  The  cuff is then  closed with figure-of-eights.  Uterosacral ligaments are  plicated in the midline with a single suture as well.  The copious  irrigation is carried out and returns clear.  All packs are removed.  The anterior peritoneum was closed in running fashion with 2-0 Monocryl  suture which is also used to reapproximate pyramidalis muscle in  midline.  Anterior rectus fascia is closed in running layer with 0 PDS  suture starting at each angle and meeting in the middle.  2-0 plain  suture is used to reapproximate the subcutaneous layer and the skin was  closed with clips.  All counts were correct.  Then turning attention to  the vagina.  The vaginal mucosa below the level of the urethra is  infiltrated with 0.5% Xylocaine with 1:200,000 epinephrine.  This was  also injected in the skin over the site of the obturator fossa  incisions.  The mucosa was then taken down in the midline below the  urethra.  Dissection is carried out bluntly and sharply to the direction  of the obturator fossa bilaterally.  Should be noted the Foley catheter  was removed prior to this and was replaced with a sterile Foley catheter  that was not drained to a bag.  After making the skin incisions  bilaterally the halo needles were passed through the obturator fossa  bilaterally controlling the passage of the needle tip with the examining  finger the entire way.  They both exited through the suburethral  incision.  The Foley catheter is then removed and cystoscopy is carried  out with 70 degrees cystoscope.  Careful inspection reveals no evidence  of perforation of the bladder and no foreign bodies.  Good puff of urine  is noted bilaterally from the ureteral orifices.  The cystoscope was  then removed.  Foley catheter is replaced. The bladder was drained.  The  polypropylene sling is then attached to the halo needles bilaterally and withdrawn through the skin incisions.  The sheath was removed.  Proper  tensioning is noted and  that the sling just meets the urethra.  However,  a Kelly clamp placed below the sling can easily be rotated perpendicular  to the floor without any tension.  The excess sling is trimmed at the  level of the skin bilaterally.  Vaginal mucosa is closed in a running  locking fashion with 2-0 Vicryl suture.  Dermabond is placed on the skin  incisions bilaterally.  Foley catheter is then replaced and drained the  bladder.  Vaginal pack with estrogen cream was placed in vagina.  All  counts are correct.  The patient is awakened, taken to recovery room in  stable condition.      Guy Sandifer Henderson Cloud,  M.D.  Electronically Signed     JET/MEDQ  D:  09/06/2006  T:  09/06/2006  Job:  [id number]

## 2010-09-25 NOTE — Discharge Summary (Signed)
NAME:  Holly Moss, Holly Moss                  ACCOUNT NO.:  [id number]   MEDICAL RECORD NO.:  [id number]          PATIENT TYPE:  INP   LOCATION:  9311                          FACILITY:  [facility]   PHYSICIAN:  Guy Sandifer. Henderson Cloud, M.D. DATE OF BIRTH:  13-Mar-1961   DATE OF ADMISSION:  09/06/2006  DATE OF DISCHARGE:  09/08/2006                               DISCHARGE SUMMARY   ADMITTING DIAGNOSES:  1. Uterine leiomyomata.  2. Stress urinary incontinence.   DISCHARGE DIAGNOSES:  1. Uterine leiomyomata.  2. Stress urinary incontinence.   PROCEDURES:  On September 06, 2006 total abdominal hysterectomy with  bilateral salpingo-oophorectomy, transobturator mid urethral tape and  biopsy of left vulva.   REASON FOR ADMISSION:  This patient is a 50 year old married white  female G3, P1, husband status post vasectomy with increasingly  symptomatic uterine leiomyomata and stress urinary incontinence.  Details are dictated in the History and Physical.  She is admitted for  surgical management.   HOSPITAL COURSE:  She is admitted to the hospital and undergoes the  above procedure.  On the evening of surgery she has good pain relief.  She does have some low back pain.  Urine output is clear.  Vital signs  are stable, and she is afebrile.  Dietary consult is ordered for the  next day in view of her history of lap banding.  Postoperative day #1  she is tolerating liquids and a few bites of solid food.  Not yet  passing flatus.  Back pain is improved.  She does have some pelvic  pressure, but she is ambulating well.  Had tried to void twice and was  unable.  Vaginal pack had been removed.  Vital signs are stable, and she  is afebrile.  She was straight catheterized.  Following that she was  able to void.  Postvoid residuals initially were approximately 300 mL.  They remained stable and drifted downward into the upper 200s.  The  patient was comfortable throughout this time.  She remained afebrile.  On the first  postoperative day hemoglobin was 11.6, white count 8.7,  platelet count 235,000.  On the day of discharge she is ambulating well,  passing flatus, tolerating her diet.  Her postvoid residuals are  approximately 240 cc.  She remains afebrile.   CONDITION ON DISCHARGE:  Good.   DIET:  Regular as tolerated.   ACTIVITY:  No lifting, no operation of automobiles, no vaginal entry.  She is to call the office for problems including increasing pain,  persistent nausea, vomiting, temperature of 101 degrees or heavy vaginal  bleeding.   MEDICATIONS:  1. Hydrocodone.  2. Acetaminophen liquid 7.5/500 mg, 300 cc, 15 mL q.6 h. p.r.n.  3. Ibuprofen 600 mg q.6 h. p.r.n.   She is given instructions on double voiding.  Follow-up is in the office  for a postop in four days for a postvoid residual check.      Guy Sandifer Henderson Cloud, M.D.  Electronically Signed     JET/MEDQ  D:  09/08/2006  T:  09/08/2006  Job:  [id number]

## 2013-05-22 ENCOUNTER — Telehealth: Payer: Self-pay | Admitting: Interventional Cardiology

## 2013-05-22 NOTE — Telephone Encounter (Signed)
ERROR

## 2013-10-29 ENCOUNTER — Encounter: Payer: Self-pay | Admitting: *Deleted

## 2014-06-07 ENCOUNTER — Other Ambulatory Visit: Payer: Self-pay | Admitting: Obstetrics and Gynecology

## 2014-06-10 LAB — CYTOLOGY - PAP

## 2014-10-01 ENCOUNTER — Encounter: Payer: Self-pay | Admitting: Physician Assistant

## 2014-10-17 ENCOUNTER — Ambulatory Visit (INDEPENDENT_AMBULATORY_CARE_PROVIDER_SITE_OTHER): Payer: BLUE CROSS/BLUE SHIELD | Admitting: Physician Assistant

## 2014-10-17 ENCOUNTER — Encounter (INDEPENDENT_AMBULATORY_CARE_PROVIDER_SITE_OTHER): Payer: Self-pay

## 2014-10-17 ENCOUNTER — Encounter: Payer: Self-pay | Admitting: Physician Assistant

## 2014-10-17 ENCOUNTER — Other Ambulatory Visit (INDEPENDENT_AMBULATORY_CARE_PROVIDER_SITE_OTHER): Payer: BLUE CROSS/BLUE SHIELD

## 2014-10-17 VITALS — BP 126/84 | HR 72 | Ht 64.25 in | Wt 195.2 lb

## 2014-10-17 DIAGNOSIS — Z1211 Encounter for screening for malignant neoplasm of colon: Secondary | ICD-10-CM

## 2014-10-17 DIAGNOSIS — D509 Iron deficiency anemia, unspecified: Secondary | ICD-10-CM | POA: Diagnosis not present

## 2014-10-17 DIAGNOSIS — R945 Abnormal results of liver function studies: Secondary | ICD-10-CM

## 2014-10-17 DIAGNOSIS — R7989 Other specified abnormal findings of blood chemistry: Secondary | ICD-10-CM | POA: Insufficient documentation

## 2014-10-17 LAB — HEPATIC FUNCTION PANEL
ALBUMIN: 4.1 g/dL (ref 3.5–5.2)
ALK PHOS: 66 U/L (ref 39–117)
ALT: 54 U/L — ABNORMAL HIGH (ref 0–35)
AST: 48 U/L — ABNORMAL HIGH (ref 0–37)
BILIRUBIN TOTAL: 0.6 mg/dL (ref 0.2–1.2)
Bilirubin, Direct: 0.1 mg/dL (ref 0.0–0.3)
Total Protein: 7 g/dL (ref 6.0–8.3)

## 2014-10-17 LAB — LIPASE: Lipase: 28 U/L (ref 11.0–59.0)

## 2014-10-17 LAB — AMYLASE: AMYLASE: 27 U/L (ref 27–131)

## 2014-10-17 LAB — IGA: IgA: 125 mg/dL (ref 68–378)

## 2014-10-17 MED ORDER — MOVIPREP 100 G PO SOLR: 1.0000 | ORAL | Status: DC

## 2014-10-17 NOTE — Progress Notes (Signed)
Patient ID: Holly Moss, female   DOB: 04-09-1961, 54 y.o.   MRN: [id number]    HPI:  Holly Moss is a 54 y.o.   female  referred by Holly Bowen, MD for evaluation of iron deficiency anemia.  Holly Moss underwent a lap band procedure in 2006. She has been feeling well since then, but recently was evaluated by Dr. Forde Dandy at a routine physical and found to have an iron deficiency anemia. She says over the past year her iron has been dropping. She has not had any change in her bowel habits or stool caliber. She has 3-6 bowel movements on a daily basis that are soft formed to mushy. Her stools have been Dahle since she was started on iron. She has not had any bright red blood per rectum. Her appetite has been good but she says she can't eat a lot of foods since she had her banding. More recently she feels as if her food gets stuck "in the top part of my stomach". She denies heartburn. He has no epigastric pain but feels very bloated after meals. She says she has been losing weight but doesn't lose abdominal girth. She admits to using Motrin or another NSAID or Tylenol daily for pain issues. She denies a family history of colon cancer, colon polyps, or inflammatory bowel disease, but states her mother had IBS. Her mother died of liver, gallbladder, and pancreatic cancer. Her father died of lung cancer.  Holly Moss has a history of fibromyalgia, GERD, kidney stones, hyperlipidemia, diabetes, depression, hypertension, and paroxysmal supraventricular tachycardia. Her surgeries have included a cesarean section, umbilical hernia repair, abdominal hysterectomy, cholecystectomy, and bariatric surgery.  She has also recently been noted to have an elevation of her transaminases. She rarely uses alcohol. She is not using herbal supplements. There is no history of hepatitis.    Past Medical History  Diagnosis Date  . PSVT (paroxysmal supraventricular tachycardia)   . Hypertension   . Depression   . Obesity   . Snoring     . Diabetes mellitus without complication   . Hyperlipidemia   . Anemia   . Anxiety   . Fibromyalgia   . GERD (gastroesophageal reflux disease)   . Kidney stones   . Urinary tract infection     Past Surgical History  Procedure Laterality Date  . Cholecystectomy    . Bariatric surgery  2006  . Umbilical hernia repair    . Abdominal hysterectomy  2008  . Cesarean section  1998  . Lapcholy  2010   Family History  Problem Relation Age of Onset  . Liver cancer Mother   . Pancreatic cancer Mother   . Gallbladder disease Mother     Gallbladder cancer  . Lung cancer Father   . Diabetes Father   . Colon cancer Neg Hx   . Colon polyps Neg Hx   . Esophageal cancer Neg Hx   . Heart disease Father    History  Substance Use Topics  . Smoking status: Never Smoker   . Smokeless tobacco: Never Used  . Alcohol Use: No   Current Outpatient Prescriptions  Medication Sig Dispense Refill  . Acetaminophen (TYLENOL EXTRA STRENGTH PO) Take 2 tablets by mouth daily. Pt takes 2 tablets, sometimes 2-3 times a day for pain    . aspirin EC 81 MG tablet Take 81 mg by mouth daily.    Marland Kitchen buPROPion (WELLBUTRIN XL) 150 MG 24 hr tablet Take 450 mg by mouth daily.    Marland Kitchen  cyclobenzaprine (FLEXERIL) 5 MG tablet Take 5 mg by mouth at bedtime.    Marland Kitchen estrogens, conjugated, (PREMARIN) 0.625 MG tablet Take 0.625 mg by mouth daily. Take daily for 21 days then do not take for 7 days.    Marland Kitchen ezetimibe-simvastatin (VYTORIN) 10-40 MG per tablet Take 1 tablet by mouth daily.    Marland Kitchen FLUoxetine (PROZAC) 20 MG capsule Take 60 mg by mouth daily.    Marland Kitchen ibuprofen (ADVIL,MOTRIN) 200 MG tablet Take 200 mg by mouth 4 (four) times daily. Pt takes OTC brand    . Iron-Folic Acid-Vit W10 (IRON FORMULA PO) Take 150 mg by mouth 3 (three) times daily. Pt uses Ferrexx Brand    . Linagliptin-Metformin HCl (JENTADUETO) 2.5-500 MG TABS Take 1 tablet by mouth 2 (two) times daily.    Marland Kitchen LORazepam (ATIVAN) 1 MG tablet Take 1 mg by mouth as  directed. 1 TABLET BEDTIME AND TWICE DAILY PRN    . Multiple Vitamin (MULTIVITAMIN WITH MINERALS) TABS tablet Take 1 tablet by mouth daily.    . naproxen sodium (ANAPROX) 220 MG tablet Take 220 mg by mouth 2 (two) times daily with a meal.    . omeprazole (PRILOSEC) 20 MG capsule Take 20 mg by mouth daily.    . traMADol (ULTRAM) 50 MG tablet Take 50 mg by mouth daily.   0  . valACYclovir (VALTREX) 500 MG tablet Take 500 mg by mouth daily.   2  . verapamil (CALAN-SR) 240 MG CR tablet Take 240 mg by mouth 2 (two) times daily.    Marland Kitchen zolpidem (AMBIEN) 10 MG tablet Take 10 mg by mouth at bedtime as needed for sleep.    Marland Kitchen MOVIPREP 100 G SOLR Take 1 kit (200 g total) by mouth as directed. 1 kit 0   No current facility-administered medications for this visit.   Allergies  Allergen Reactions  . Penicillins Rash and Other (See Comments)    Pt can take other derivatives of this medicine without any side effects   . Nubain [Nalbuphine Hcl] Nausea And Vomiting     Review of Systems: Gen: Denies any fever, chills, sweats, anorexia, fatigue, weakness, malaise, weight loss, and sleep disorder CV: Denies chest pain, angina, palpitations, syncope, orthopnea, PND, peripheral edema, and claudication. Resp: Denies dyspnea at rest, dyspnea with exercise, cough, sputum, wheezing, coughing up blood, and pleurisy. GI: Denies vomiting blood, jaundice, and fecal incontinence.   Denies dysphagia or odynophagia. GU : Denies urinary burning, blood in urine, urinary frequency, urinary hesitancy, nocturnal urination, and urinary incontinence. MS: Admits to multiple joint pains that she attributes to her fibromyalgia Derm: Denies rash, itching, dry skin, hives, moles, warts, or unhealing ulcers.  Psych: Denies  memory loss, suicidal ideation, hallucinations, paranoia, and confusion. Heme: Denies bruising, bleeding, and enlarged lymph nodes. Neuro:  Denies any headaches, dizziness, paresthesias.     LAB  RESULTS: Blood work from 10/15/2014 showed a TIBC 401, U IBC 379, iron 22, iron saturation 5%, vitamin B-12 704. CBC had WBC 8.2, hemoglobin 11.4, hematocrit 37.3, platelets 291,000, MCV 74.8. Comprehensive metabolic panel showed AST 47, ALT 60, alkaline phosphatase 72, total bili 0.6, direct bili 0.2. TSH 1.1 Free T4 1 0.3 Vitamin D 54.7 Sedimentation rate 24   Physical Exam: BP 126/84 mmHg  Pulse 72  Ht 5' 4.25" (1.632 m)  Wt 195 lb 4 oz (88.565 kg)  BMI 33.25 kg/m2 Constitutional: Pleasant,well-developed female in no acute distress. HEENT: Normocephalic and atraumatic. Conjunctivae are normal. No scleral icterus. Neck supple. No  JVD Cardiovascular: Normal rate, regular rhythm.  Pulmonary/chest: Effort normal and breath sounds normal. No wheezing, rales or rhonchi. Abdominal: Soft, nondistended, nontender. Bowel sounds active throughout. There are no masses palpable. No hepatomegaly. Rectal: Pfiffner brown stool, heme negative Extremities: no edema Lymphadenopathy: No cervical adenopathy noted. Neurological: Alert and oriented to person place and time. Skin: Skin is warm and dry. No rashes noted. Psychiatric: Normal mood and affect. Behavior is normal.  ASSESSMENT AND PLAN: #1. Iron deficiency anemia. She has been found to have a iron deficiency anemia with heme-negative stools. IgA and tissue transglutaminase will be obtained to evaluate for possible celiac. She will be scheduled for an EGD to evaluate for possible gastritis, ulcers, or AVMs as well as to obtain small bowel biopsy to evaluate for possible celiac.The risks, benefits, and alternatives to endoscopy with possible biopsy and possible dilation were discussed with the patient and they consent to proceed. As she is over the age of 42 and has not had a colonoscopy, she will be scheduled for a colonoscopy at the same setting to evaluate for polyps, neoplasia, AVMs, etc., as well as to obtain biopsies for microscopic colitis as an  etiology of her frequent stools.The risks, benefits, and alternatives to colonoscopy with possible biopsy and possible polypectomy were discussed with the patient and they consent to proceed.  The procedures will be scheduled with Dr. Henrene Pastor.  #2. Abnormal LFTs. Patient has been found to have a mild elevation of her transaminases. This is most likely due from fatty liver. An ultrasound will be obtained for further evaluation along with a repeat hepatic panel.  Further recommendations will be made pending the findings of the above.    Lakeria Starkman, Vita Barley PA-C 10/17/2014, 12:09 PM  CC: Holly Bowen, MD

## 2014-10-17 NOTE — Patient Instructions (Signed)
Please go to the basement level to have your labs drawn.  You have been scheduled for an endoscopy and colonoscopy. Please follow the written instructions given to you at your visit today. Please pick up your prep supplies at the pharmacy within the next 1-3 days. [facility] [place], [place]. If you use inhalers (even only as needed), please bring them with you on the day of your procedure. Your physician has requested that you go to www.startemmi.com and enter the access code given to you at your visit today. This web site gives a general overview about your procedure. However, you should still follow specific instructions given to you by our office regarding your preparation for the procedure.  Stop the iron 5 days prior to the procedure date.   You have been scheduled for an abdominal ultrasound at [facility] (1st floor of hospital) on Friday 10-25-2014 at 8:00 am . Please arrive  At 7:45 am  prior to your appointment for registration. Make certain not to have anything to eat or drink 6 hours prior to your appointment. Should you need to reschedule your appointment, please contact radiology at [phone]. This test typically takes about 30 minutes to perform.

## 2014-10-17 NOTE — Progress Notes (Signed)
Agree with initial assessment and plan as outlined 

## 2014-10-18 LAB — TISSUE TRANSGLUTAMINASE, IGG: TISSUE TRANSGLUT AB: 1 U/mL (ref <6)

## 2014-10-21 NOTE — Progress Notes (Signed)
Quick Note:  Left a message for patient to call for results. ______ 

## 2014-10-25 ENCOUNTER — Ambulatory Visit (HOSPITAL_COMMUNITY)
Admission: RE | Admit: 2014-10-25 | Discharge: 2014-10-25 | Disposition: A | Discharge status: Home / Self Care | Payer: BLUE CROSS/BLUE SHIELD | Source: Ambulatory Visit | Attending: Physician Assistant | Admitting: Physician Assistant

## 2014-10-25 DIAGNOSIS — R945 Abnormal results of liver function studies: Secondary | ICD-10-CM

## 2014-10-25 DIAGNOSIS — Z9049 Acquired absence of other specified parts of digestive tract: Secondary | ICD-10-CM | POA: Diagnosis not present

## 2014-10-25 DIAGNOSIS — R7989 Other specified abnormal findings of blood chemistry: Secondary | ICD-10-CM | POA: Diagnosis not present

## 2014-10-25 DIAGNOSIS — D509 Iron deficiency anemia, unspecified: Secondary | ICD-10-CM

## 2014-10-25 DIAGNOSIS — Z1211 Encounter for screening for malignant neoplasm of colon: Secondary | ICD-10-CM

## 2014-10-29 ENCOUNTER — Other Ambulatory Visit: Payer: Self-pay | Admitting: *Deleted

## 2014-10-29 DIAGNOSIS — K760 Fatty (change of) liver, not elsewhere classified: Secondary | ICD-10-CM

## 2014-11-25 ENCOUNTER — Encounter: Payer: Self-pay | Admitting: Internal Medicine

## 2014-11-25 ENCOUNTER — Ambulatory Visit (AMBULATORY_SURGERY_CENTER): Payer: BLUE CROSS/BLUE SHIELD | Admitting: Internal Medicine

## 2014-11-25 VITALS — BP 131/79 | HR 84 | Temp 96.3°F | Resp 15

## 2014-11-25 DIAGNOSIS — R1314 Dysphagia, pharyngoesophageal phase: Secondary | ICD-10-CM | POA: Diagnosis not present

## 2014-11-25 DIAGNOSIS — Z1211 Encounter for screening for malignant neoplasm of colon: Secondary | ICD-10-CM

## 2014-11-25 DIAGNOSIS — D509 Iron deficiency anemia, unspecified: Secondary | ICD-10-CM | POA: Diagnosis not present

## 2014-11-25 LAB — GLUCOSE, CAPILLARY
GLUCOSE-CAPILLARY: 140 mg/dL — AB (ref 65–99)
Glucose-Capillary: 135 mg/dL — ABNORMAL HIGH (ref 65–99)

## 2014-11-25 MED ORDER — SODIUM CHLORIDE 0.9 % IV SOLN: 500.0000 mL | INTRAVENOUS | Status: DC

## 2014-11-25 NOTE — Op Note (Signed)
[facility] [address] [place], [id number]   ENDOSCOPY PROCEDURE REPORT  PATIENT: Holly PlaterDark, Garima P  MR#: [id number] BIRTHDATE: 1960-07-16 , 54  yrs. old GENDER: female ENDOSCOPIST: Roxy CedarJohn N Perry Jr, MD REFERRED BY:  Adrian PrinceStephen South, M.D. PROCEDURE DATE:  11/25/2014 PROCEDURE:  EGD, diagnostic ASA CLASS:     Class II INDICATIONS:  dysphagia and iron deficiency anemia. MEDICATIONS: Monitored anesthesia care and Propofol 100 mg IV TOPICAL ANESTHETIC: none  DESCRIPTION OF PROCEDURE: After the risks benefits and alternatives of the procedure were thoroughly explained, informed consent was obtained.  The LB [id number] endoscope was introduced through the mouth and advanced to the second portion of the duodenum , Without limitations.  The instrument was slowly withdrawn as the mucosa was fully examined.  EXAM:The esophagus was normal until the gastroesophageal junction where there was slight resistance from the previously placed lap band.  There is mild inflammation.  No obvious stricture.  Stomach was normal except for the presence of the previously placed lap band best viewed on retroflexion.  The duodenum was normal. Retroflexed views revealed as previously described.     The scope was then withdrawn from the patient and the procedure completed.  COMPLICATIONS: There were no immediate complications.  ENDOSCOPIC IMPRESSION: 1. Slight resistance in the distal esophagus in the region of previously placed lap band. Mild inflammation as well 2. Otherwise normal EGD status post lap band placement  RECOMMENDATIONS: 1. Recommend daily iron supplementation 2. Follow-up with Dr. Evlyn KannerSouth who can monitor your blood counts and iron counts 3. If swallowing becomes a significant problem, see Dr. Johna SheriffHoxworth regarding possible reduction of lap band of volume  REPEAT EXAM:  eSigned:  Roxy CedarJohn N Perry Jr, MD 11/25/2014 2:40 PM    FA:[id number]:Stephen Evlyn KannerSouth, MD, Glenna FellowsBenjamin Hoxworth, MD,  and The Patient

## 2014-11-25 NOTE — Progress Notes (Signed)
A/ox3, pleased with MAC, report to RN 

## 2014-11-25 NOTE — Progress Notes (Signed)
Patient concerned that we know she has had Gastric Lap Band procedure.

## 2014-11-25 NOTE — Op Note (Signed)
[facility] [address] [place], [id number]   COLONOSCOPY PROCEDURE REPORT  PATIENT: Holly Moss, Holly Moss  MR#: [id number] BIRTHDATE: [birth year] , 54  yrs. old GENDER: female ENDOSCOPIST: Roxy CedarJohn N Perry Jr, MD REFERRED NG:EXBMWUXBY:Stephen Evlyn KannerSouth, M.D. PROCEDURE DATE:  11/25/2014 PROCEDURE:   Colonoscopy, screening First Screening Colonoscopy - Avg.  risk and is 50 yrs.  old or older - No.  Prior Negative Screening - Now for repeat screening. N/A  History of Adenoma - Now for follow-up colonoscopy & has been > or = to 3 yrs.  N/A  Polyps removed today? No Recommend repeat exam, <10 yrs? No ASA CLASS:   Class II INDICATIONS:SCREENING,average risk patient for colon cancer. Iron deficiency anemia. MEDICATIONS: Monitored anesthesia care and Propofol 300 mg IV  DESCRIPTION OF PROCEDURE:   After the risks benefits and alternatives of the procedure were thoroughly explained, informed consent was obtained.  The digital rectal exam revealed no abnormalities of the rectum.   The LB [id number]  endoscope was introduced through the anus and advanced to the cecum, which was identified by both the appendix and ileocecal valve. No adverse events experienced.   The quality of the prep was excellent. (MoviPrep was used)  The instrument was then slowly withdrawn as the colon was fully examined. Estimated blood loss is zero unless otherwise noted in this procedure report.      COLON FINDINGS: There was mild diverticulosis noted in the sigmoid colon.   The examination was otherwise normal.  Retroflexed views revealed internal hemorrhoids. The time to cecum = 2.9 Withdrawal time = 9.2   The scope was withdrawn and the procedure completed. COMPLICATIONS: There were no immediate complications.  ENDOSCOPIC IMPRESSION: 1.   Mild diverticulosis was noted in the sigmoid colon 2.   The examination was otherwise normal  RECOMMENDATIONS: 1.  Continue current colorectal screening  recommendations for "routine risk" patients with a repeat colonoscopy in 10 years. 2.  Upper endoscopy today (please see report)  eSigned:  Roxy CedarJohn N Perry Jr, MD 11/25/2014 2:35 PM   cc: The Patient and Adrian PrinceStephen South, MD

## 2014-11-25 NOTE — Progress Notes (Signed)
No problems noted in the recovery room. maw 

## 2014-11-25 NOTE — Patient Instructions (Addendum)
YOU HAD AN ENDOSCOPIC PROCEDURE TODAY AT THE [facility]:   Refer to the procedure report that was given to you for any specific questions about what was found during the examination.  If the procedure report does not answer your questions, please call your gastroenterologist to clarify.  If you requested that your care partner not be given the details of your procedure findings, then the procedure report has been included in a sealed envelope for you to review at your convenience later.  YOU SHOULD EXPECT: Some feelings of bloating in the abdomen. Passage of more gas than usual.  Walking can help get rid of the air that was put into your GI tract during the procedure and reduce the bloating. If you had a lower endoscopy (such as a colonoscopy or flexible sigmoidoscopy) you may notice spotting of blood in your stool or on the toilet paper. If you underwent a bowel prep for your procedure, you may not have a normal bowel movement for a few days.  Please Note:  You might notice some irritation and congestion in your nose or some drainage.  This is from the oxygen used during your procedure.  There is no need for concern and it should clear up in a day or so.  SYMPTOMS TO REPORT IMMEDIATELY:   Following lower endoscopy (colonoscopy or flexible sigmoidoscopy):  Excessive amounts of blood in the stool  Significant tenderness or worsening of abdominal pains  Swelling of the abdomen that is new, acute  Fever of 100F or higher   Following upper endoscopy (EGD)  Vomiting of blood or coffee ground material  New chest pain or pain under the shoulder blades  Painful or persistently difficult swallowing  New shortness of breath  Fever of 100F or higher  Black, tarry-looking stools  For urgent or emergent issues, a gastroenterologist can be reached at any hour by calling [phone].   DIET: Your first meal following the procedure should be a small meal and then it is ok to progress to  your normal diet. Heavy or fried foods are harder to digest and may make you feel nauseous or bloated.  Likewise, meals heavy in dairy and vegetables can increase bloating.  Drink plenty of fluids but you should avoid alcoholic beverages for 24 hours.  ACTIVITY:  You should plan to take it easy for the rest of today and you should NOT DRIVE or use heavy machinery until tomorrow (because of the sedation medicines used during the test).    FOLLOW UP: Our staff will call the number listed on your records the next business day following your procedure to check on you and address any questions or concerns that you may have regarding the information given to you following your procedure. If we do not reach you, we will leave a message.  However, if you are feeling well and you are not experiencing any problems, there is no need to return our call.  We will assume that you have returned to your regular daily activities without incident.  If any biopsies were taken you will be contacted by phone or by letter within the next 1-3 weeks.  Please call us at [phone] if you have not heard about the biopsies in 3 weeks.    SIGNATURES/CONFIDENTIALITY: You and/or your care partner have signed paperwork which will be entered into your electronic medical record.  These signatures attest to the fact that that the information above on your After Visit Summary has been reviewed   and is understood.  Full responsibility of the confidentiality of this discharge information lies with you and/or your care-partner.     Handouts were given to your care partner on diverticulosis and a high fiber diet with liberal fluid intake. You may resume your current medications today.  Recommended daily iron supplement per Dr. Marina GoodellPerry. Follow up with Dr. Evlyn KannerSouth who can monitor your blood counts and iron counts. If swallowing becomes a significant problem, see Dr. Johna SheriffHoxworth regarding possible reduction of lap band volume. Your blood  sugar was 140 in the recovery room. Please call if any questions or concerns.

## 2014-11-26 ENCOUNTER — Telehealth: Payer: Self-pay | Admitting: *Deleted

## 2014-11-26 NOTE — Telephone Encounter (Signed)

## 2014-12-06 ENCOUNTER — Telehealth: Payer: Self-pay | Admitting: Hematology

## 2014-12-06 NOTE — Telephone Encounter (Signed)
new patient appt-s/w patient and gave np appt for 08/04 @ 10:30 w/Dr. Candise Che Referrign Dr. Adrian Prince  Dx- Anemia   Referral scanned

## 2014-12-12 ENCOUNTER — Other Ambulatory Visit: Payer: BLUE CROSS/BLUE SHIELD

## 2014-12-12 ENCOUNTER — Telehealth: Payer: Self-pay | Admitting: Hematology

## 2014-12-12 ENCOUNTER — Ambulatory Visit (HOSPITAL_BASED_OUTPATIENT_CLINIC_OR_DEPARTMENT_OTHER): Payer: BLUE CROSS/BLUE SHIELD | Admitting: Hematology

## 2014-12-12 ENCOUNTER — Encounter: Payer: Self-pay | Admitting: Hematology

## 2014-12-12 ENCOUNTER — Ambulatory Visit: Payer: BLUE CROSS/BLUE SHIELD

## 2014-12-12 ENCOUNTER — Ambulatory Visit (HOSPITAL_BASED_OUTPATIENT_CLINIC_OR_DEPARTMENT_OTHER): Payer: BLUE CROSS/BLUE SHIELD

## 2014-12-12 VITALS — BP 142/97 | HR 107 | Temp 98.5°F | Resp 18 | Ht 64.25 in | Wt 197.6 lb

## 2014-12-12 DIAGNOSIS — I10 Essential (primary) hypertension: Secondary | ICD-10-CM

## 2014-12-12 DIAGNOSIS — D509 Iron deficiency anemia, unspecified: Secondary | ICD-10-CM

## 2014-12-12 DIAGNOSIS — E119 Type 2 diabetes mellitus without complications: Secondary | ICD-10-CM

## 2014-12-12 DIAGNOSIS — R5383 Other fatigue: Secondary | ICD-10-CM

## 2014-12-12 LAB — CBC & DIFF AND RETIC
BASO%: 0.5 % (ref 0.0–2.0)
Basophils Absolute: 0.1 10*3/uL (ref 0.0–0.1)
EOS%: 2.9 % (ref 0.0–7.0)
Eosinophils Absolute: 0.3 10*3/uL (ref 0.0–0.5)
HCT: 38.6 % (ref 34.8–46.6)
HGB: 12 g/dL (ref 11.6–15.9)
Immature Retic Fract: 25.4 % — ABNORMAL HIGH (ref 1.60–10.00)
LYMPH%: 31.8 % (ref 14.0–49.7)
MCH: 22.1 pg — AB (ref 25.1–34.0)
MCHC: 31.1 g/dL — AB (ref 31.5–36.0)
MCV: 71.2 fL — AB (ref 79.5–101.0)
MONO#: 0.9 10*3/uL (ref 0.1–0.9)
MONO%: 9 % (ref 0.0–14.0)
NEUT%: 55.8 % (ref 38.4–76.8)
NEUTROS ABS: 5.5 10*3/uL (ref 1.5–6.5)
Platelets: 261 10*3/uL (ref 145–400)
RBC: 5.42 10*6/uL (ref 3.70–5.45)
RDW: 18.2 % — ABNORMAL HIGH (ref 11.2–14.5)
Retic %: 2.14 % — ABNORMAL HIGH (ref 0.70–2.10)
Retic Ct Abs: 115.99 10*3/uL — ABNORMAL HIGH (ref 33.70–90.70)
WBC: 9.9 10*3/uL (ref 3.9–10.3)
lymph#: 3.1 10*3/uL (ref 0.9–3.3)

## 2014-12-12 LAB — IRON AND TIBC CHCC
%SAT: 47 % (ref 21–57)
Iron: 211 ug/dL — ABNORMAL HIGH (ref 41–142)
TIBC: 450 ug/dL — ABNORMAL HIGH (ref 236–444)
UIBC: 239 ug/dL (ref 120–384)

## 2014-12-12 LAB — CHCC SMEAR

## 2014-12-12 LAB — COMPREHENSIVE METABOLIC PANEL (CC13)
ALT: 71 U/L — ABNORMAL HIGH (ref 0–55)
AST: 96 U/L — AB (ref 5–34)
Albumin: 3.6 g/dL (ref 3.5–5.0)
Alkaline Phosphatase: 68 U/L (ref 40–150)
Anion Gap: 10 mEq/L (ref 3–11)
BILIRUBIN TOTAL: 0.7 mg/dL (ref 0.20–1.20)
BUN: 13.3 mg/dL (ref 7.0–26.0)
CALCIUM: 9.8 mg/dL (ref 8.4–10.4)
CO2: 24 mEq/L (ref 22–29)
Chloride: 105 mEq/L (ref 98–109)
Creatinine: 0.7 mg/dL (ref 0.6–1.1)
GLUCOSE: 130 mg/dL (ref 70–140)
Potassium: 4.1 mEq/L (ref 3.5–5.1)
Sodium: 139 mEq/L (ref 136–145)
Total Protein: 6.8 g/dL (ref 6.4–8.3)

## 2014-12-12 LAB — SEDIMENTATION RATE: Sed Rate: 4 mm/hr (ref 0–30)

## 2014-12-12 LAB — FERRITIN CHCC: Ferritin: 31 ng/ml (ref 9–269)

## 2014-12-12 NOTE — Telephone Encounter (Signed)
per pof to sch pt appt-gave pt copy of avs °

## 2014-12-12 NOTE — Progress Notes (Signed)
Marland Kitchen    CONSULT NOTE  Patient Care Team: Adrian Prince, MD as PCP - General (Endocrinology)  CHIEF COMPLAINTS/PURPOSE OF CONSULTATION:  Anemia  HISTORY OF PRESENTING ILLNESS:  Holly Moss is a pleasant 54 y.o. retired Engineer, civil (consulting) with a history of hypertension, dyslipidemia, diabetes, obesity status post lab band placement in 2006 has been referred by Dr. Adrian Prince for evaluation and management of anemia.  Patient was apparently noted to have iron deficiency anemia and was noted to have heme negative stools. She notes that she has been on Nu-iron for about a year with no significant improvement in her iron deficiency. She was referred to Dr. Marina Goodell from GI for GI workup and had an EGD and colonoscopy last month that did not reveal any evidence of overt GI bleeding or actionable lesions. She has not had a capsule endoscopy. She had a partial workup for celiac disease including negative transglutaminase antibody. She reports no issues with eating both containing foods.  She notes that she has recently started taking over-the-counter ferrous sulfate 3 times a day over the last week which she is tolerating with some mild nausea but no vomiting.   She was also noted to have abnormal LFTs which she is being followed. She had an ultrasound of her abdomen on 10/25/2014 which showed an echogenic liver consistent with fatty infiltration and/or hepatocellular disease but no focal hepatic abnormality. Status post cholecystectomy status.  Patient notes no evidence of melena, hematemesis, hematuria, hemoptysis, epistaxis or other overt bleeding.  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 She also notes that she has had some issues with dysphagia which were thought to be due to her lap band and notes that this prevents her from eating reasonably balanced diet.  No overt evidence of celiac disease on endoscopy. No issues with chronic  diarrhea or other markers of malabsorption more broadly. Has been on chronic metformin therapy which would serve as a B12 deficiency risk factor for B12 levels are currently normal.  Patient has been on chronic high-dose NSAID medications plus aspirin plus when necessary tramadol all of which together increase her risk of occult GI bleeding. She was recommended to discuss with her PCP about alternative treatment options.  Notes chronic myalgias and tender points charted out to fibromyalgia as per patient . Notes presence of restless legs. No pica symptoms.  MEDICAL HISTORY:  Past Medical History  Diagnosis Date  . PSVT (paroxysmal supraventricular tachycardia)   . Hypertension   . Depression   . Obesity   . Snoring   . Diabetes mellitus without complication   . Hyperlipidemia   . Anemia   . Anxiety   . Fibromyalgia   . GERD (gastroesophageal reflux disease)   . Kidney stones   . Urinary tract infection     SURGICAL HISTORY: Past Surgical History  Procedure Laterality Date  . Cholecystectomy    . Bariatric surgery  2006  . Umbilical hernia repair    . Abdominal hysterectomy  2008  . Cesarean section  1998  . Lapcholy  2010  . Myomectomy      SOCIAL HISTORY: History   Social History  . Marital Status: Married    Spouse Name: N/A  . Number of Children: 1  . Years of Education: N/A   Occupational History  . Semi-Retired RN    Social History Main Topics  . Smoking status: Never Smoker   .  Smokeless tobacco: Never Used  . Alcohol Use: No  . Drug Use: No  . Sexual Activity: Not on file   Other Topics Concern  . Not on file   Social History Narrative    FAMILY HISTORY: Family History  Problem Relation Age of Onset  . Liver cancer Mother   . Pancreatic cancer Mother   . Gallbladder disease Mother     Gallbladder cancer  . Lung cancer Father   . Diabetes Father   . Heart disease Father   . Colon cancer Neg Hx   . Colon polyps Neg Hx   . Esophageal  cancer Neg Hx     ALLERGIES:  is allergic to penicillins and nubain.  MEDICATIONS:  Current Outpatient Prescriptions  Medication Sig Dispense Refill  . Acetaminophen (TYLENOL EXTRA STRENGTH PO) Take 2 tablets by mouth daily. Pt takes 2 tablets, sometimes 2-3 times a day for pain    . aspirin EC 81 MG tablet Take 81 mg by mouth daily.    Marland Kitchen buPROPion (WELLBUTRIN XL) 150 MG 24 hr tablet Take 450 mg by mouth daily.    . cyclobenzaprine (FLEXERIL) 5 MG tablet Take 5 mg by mouth at bedtime.    Marland Kitchen estrogens, conjugated, (PREMARIN) 0.625 MG tablet Take 0.625 mg by mouth daily.     Marland Kitchen ezetimibe-simvastatin (VYTORIN) 10-40 MG per tablet Take 1 tablet by mouth daily.    Marland Kitchen FLUoxetine (PROZAC) 20 MG capsule Take 60 mg by mouth daily.    Marland Kitchen ibuprofen (ADVIL,MOTRIN) 200 MG tablet Take 200 mg by mouth 4 (four) times daily. Pt takes OTC brand    . iron polysaccharides (NIFEREX) 150 MG capsule Take 150 mg by mouth 3 (three) times daily.    . Linagliptin-Metformin HCl (JENTADUETO) 2.5-500 MG TABS Take 1 tablet by mouth 2 (two) times daily.    Marland Kitchen LORazepam (ATIVAN) 1 MG tablet Take 1 mg by mouth as directed. 1 TABLET BEDTIME AND TWICE DAILY PRN    . Multiple Vitamin (MULTIVITAMIN WITH MINERALS) TABS tablet Take 1 tablet by mouth daily.    . naproxen sodium (ANAPROX) 220 MG tablet Take 220 mg by mouth 2 (two) times daily with a meal.    . omeprazole (PRILOSEC) 20 MG capsule Take 20 mg by mouth daily.    . traMADol (ULTRAM) 50 MG tablet Take 50 mg by mouth daily.   0  . valACYclovir (VALTREX) 500 MG tablet Take 500 mg by mouth daily.   2  . verapamil (CALAN-SR) 240 MG CR tablet Take 240 mg by mouth 2 (two) times daily.    Marland Kitchen zolpidem (AMBIEN) 10 MG tablet Take 10 mg by mouth at bedtime as needed for sleep.     No current facility-administered medications for this visit.    REVIEW OF SYSTEMS:   Constitutional: Denies fevers, chills or abnormal night sweats Eyes: Denies blurriness of vision, double vision or  watery eyes Ears, nose, mouth, throat, and face: Denies mucositis or sore throat Respiratory: Denies cough, dyspnea or wheezes Cardiovascular: Denies palpitation, chest discomfort or lower extremity swelling Gastrointestinal:  Denies nausea, heartburn or change in bowel habits Skin: Denies abnormal skin rashes Lymphatics: Denies new lymphadenopathy or easy bruising Neurological:Denies numbness, tingling or new weaknesses Behavioral/Psych: Mood is stable, no new changes  All other systems were reviewed with the patient and are negative.   PHYSICAL EXAMINATION: ECOG PERFORMANCE STATUS: 1 - Symptomatic but completely ambulatory  Filed Vitals:   12/12/14 1128  BP: 142/97  Pulse: 107  Temp: 98.5 F (36.9 C)  Resp: 18   Filed Weights   12/12/14 1128  Weight: 197 lb 9.6 oz (89.631 kg)    GENERAL:alert, no distress and comfortable SKIN: skin color, texture, turgor are normal, no rashes or significant lesions EYES: normal, conjunctiva are pink and non-injected, sclera clear OROPHARYNX:no exudate, no erythema and lips, buccal mucosa, and tongue normal  NECK: supple, thyroid normal size, non-tender, without nodularity LYMPH:  no palpable lymphadenopathy in the cervical, axillary or inguinal LUNGS: clear to auscultation and percussion with normal breathing effort HEART: regular rate & rhythm and no murmurs and no lower extremity edema ABDOMEN:abdomen soft, non-tender and normal bowel sounds Musculoskeletal:no cyanosis of digits and no clubbing  PSYCH: alert & oriented x 3 with fluent speech NEURO: no focal motor/sensory deficits  LABORATORY DATA:   . CBC Latest Ref Rng 12/12/2014 07/07/2008  WBC 3.9 - 10.3 10e3/uL 9.9 7.5  Hemoglobin 11.6 - 15.9 g/dL 82.9 56.2  Hematocrit 13.0 - 46.6 % 38.6 40.9  Platelets 145 - 400 10e3/uL 261 290   . CBC    Component Value Date/Time   WBC 9.9 12/12/2014 1249   WBC 7.5 07/07/2008 1022   RBC 5.42 12/12/2014 1249   RBC 5.20* 07/07/2008 1022     HGB 12.0 12/12/2014 1249   HGB 13.1 07/07/2008 1022   HCT 38.6 12/12/2014 1249   HCT 40.9 07/07/2008 1022   PLT 261 12/12/2014 1249   PLT 290 07/07/2008 1022   MCV 71.2* 12/12/2014 1249   MCV 78.6 07/07/2008 1022   MCH 22.1* 12/12/2014 1249   MCHC 31.1* 12/12/2014 1249   MCHC 32.1 07/07/2008 1022   RDW 18.2* 12/12/2014 1249   RDW 16.1* 07/07/2008 1022   LYMPHSABS 3.1 12/12/2014 1249   LYMPHSABS 2.0 07/07/2008 1022   MONOABS 0.9 12/12/2014 1249   MONOABS 0.6 07/07/2008 1022   EOSABS 0.3 12/12/2014 1249   EOSABS 0.2 07/07/2008 1022   BASOSABS 0.1 12/12/2014 1249   BASOSABS 0.0 07/07/2008 1022     Recent Labs  10/17/14 1118 12/12/14 1249  NA  --  139  K  --  4.1  CO2  --  24  GLUCOSE  --  130  BUN  --  13.3  CREATININE  --  0.7  CALCIUM  --  9.8  PROT 7.0 6.8  ALBUMIN 4.1 3.6  AST 48* 96*  ALT 54* 71*  ALKPHOS 66 68  BILITOT 0.6 0.70  BILIDIR 0.1  --    . Lab Results  Component Value Date   IRON 211* 12/12/2014   TIBC 450* 12/12/2014   IRONPCTSAT 47 12/12/2014   (Iron and TIBC)  Lab Results  Component Value Date   FERRITIN 31 12/12/2014   Peripheral blood smear [reviewed by me]: Microcytic hypochromic cells with anisopoikilocytosis, no schistocytes. Some polychromasia. Pelgeroid neutrophils noted. Adequate platelets. No blasts. No nRBCs.  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 Labs today show a hemoglobin of 12, MCV 71.2 with the normal WBC count and platelets. Patient has been on iron sulfate for about a week and Nu-Iron prior to then. Patient currently does not have significant iron deficiency based on labs with a ferritin of 31 and iron saturation of 47% however these labs could be misleading in the setting of transaminitis with release of ferritin from liver stores. It is interesting that her MCV has gone down to 71 from previous  despite oral iron  therapy. No overt blood loss. 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  Other less likely differential diagnoses could include a) anemia of chronic disease - patient has no known diagnosis of inflammatory disorder though she has generalized arthralgias and myalgias which have been diagnosed as fibromyalgia. She notes that she has not been seen by a rheumatologist for this. Her fatigue seems out of proportion to her anemia. B) occult malignancy though less likely. No evidence of this currently. EGD and colonoscopy negative, recent Pap smear negative, patient notes she has had a mammogram which was negative. Does note some esophageal dysphagia which could not be explained by her EGD -might need workup by primary care physician. C) Rare disorders much less likely - Zn deficiency, rarely copper deficiency, lead exposure MDS with acquired thalassemia. Plan  -Continue ferrous sulfate 1 tablet by mouth 3 times a day with orange juice. -Reexamine lab iron parameters in 6-8 weeks with CBC, reticulocyte, ferritin, iron saturation. -If patient fails to replace iron stores adequately or has GI intolerance to oral iron consider IV iron replacement. Currently patient's hemoglobin is reasonable enough to give her an adequate trial of adequate oral iron replacement. -If her microcytosis or anemia persist after adequate iron replacement we might need to broaden her workup with zinc, copper, SPEP QIG IFE, hemoglobin electrophoresis. -Given her esophageal dysphagia symptoms that are not clearly explainable by her lap band or EGD findings might consider getting a CT of her chest to rule out extrinsic compression. Will allow primary care physician to decide this. -She might also benefit from rheumatology evaluation to confirm that it is in fact fibromyalgia that explains her generalized body aches and lower back pain for which she is taking a fair amount of pain medications. -Discussed  with primary care physician about considering stopping her PPI therapy and also alternative approaches to pain management that does not include using a combination of NSAIDs plus aspirin plus tramadol which together increase the risk of GI bleeding significantly. - it Would be reasonable to take  B12 supplementation daily given long term metformin use puts her at risk for B12 deficiency and her B12 levels are likely overestimated in the setting of transaminitis.   Return to care with Dr. Candise Che in 6-8 weeks with CBC, reticulocyte count, ferritin, iron profile and extra tube. Earlier if any acute new concerns or questions arise.  I appreciate the privilege of taking care of this wonderful patient.  All questions were answered. The patient knows to call the clinic with any problems, questions or concerns. I spent 45 minutes counseling the patient face to face. The total time spent in the appointment was 60 minutes and more than 50% was on counseling and personally reviewing her peripheral blood smear.  Wyvonnia Lora MD MS Hematology/Oncology Physician [facility]  (Office):       [phone] (Work cell):  [phone] (Fax):           [phone]

## 2015-01-27 ENCOUNTER — Telehealth: Payer: Self-pay

## 2015-01-27 NOTE — Telephone Encounter (Signed)
-----   Message from Daphine Deutscher, RN sent at 10/29/2014  2:44 PM EDT ----- Patient needs LFT on 01/27/15. Lab in [facility]

## 2015-01-27 NOTE — Telephone Encounter (Signed)
Pt aware.

## 2015-02-10 ENCOUNTER — Telehealth: Payer: Self-pay | Admitting: Hematology

## 2015-02-10 NOTE — Telephone Encounter (Signed)
pt cld to Cx appt-stated she will call later to r/s

## 2015-02-11 ENCOUNTER — Ambulatory Visit: Payer: BLUE CROSS/BLUE SHIELD | Admitting: Hematology

## 2016-12-09 ENCOUNTER — Other Ambulatory Visit: Payer: Self-pay | Admitting: Endocrinology

## 2016-12-09 DIAGNOSIS — M5416 Radiculopathy, lumbar region: Secondary | ICD-10-CM

## 2016-12-18 ENCOUNTER — Ambulatory Visit
Admission: RE | Admit: 2016-12-18 | Discharge: 2016-12-18 | Disposition: A | Discharge status: Home / Self Care | Payer: BLUE CROSS/BLUE SHIELD | Source: Ambulatory Visit | Attending: Endocrinology | Admitting: Endocrinology

## 2016-12-18 DIAGNOSIS — M5416 Radiculopathy, lumbar region: Secondary | ICD-10-CM

## 2016-12-18 MED ORDER — GADOBENATE DIMEGLUMINE 529 MG/ML IV SOLN
17.0000 mL | Freq: Once | INTRAVENOUS | Status: AC | PRN
  Administered 2016-12-18: 17 mL via INTRAVENOUS

## 2017-03-09 ENCOUNTER — Other Ambulatory Visit: Payer: Self-pay | Admitting: Endocrinology

## 2017-03-09 DIAGNOSIS — M5416 Radiculopathy, lumbar region: Secondary | ICD-10-CM

## 2017-03-14 ENCOUNTER — Ambulatory Visit
Admission: RE | Admit: 2017-03-14 | Discharge: 2017-03-14 | Disposition: A | Discharge status: Home / Self Care | Payer: BLUE CROSS/BLUE SHIELD | Source: Ambulatory Visit | Attending: Endocrinology | Admitting: Endocrinology

## 2017-03-14 DIAGNOSIS — M5416 Radiculopathy, lumbar region: Secondary | ICD-10-CM

## 2017-03-14 MED ORDER — IOPAMIDOL (ISOVUE-M 200) INJECTION 41%
1.0000 mL | Freq: Once | INTRAMUSCULAR | Status: AC
  Administered 2017-03-14: 1 mL via EPIDURAL

## 2017-03-14 MED ORDER — METHYLPREDNISOLONE ACETATE 40 MG/ML INJ SUSP (RADIOLOG
120.0000 mg | Freq: Once | INTRAMUSCULAR | Status: AC
  Administered 2017-03-14: 120 mg via EPIDURAL

## 2017-03-14 NOTE — Discharge Instructions (Signed)

## 2017-05-13 ENCOUNTER — Other Ambulatory Visit: Payer: Self-pay | Admitting: General Surgery

## 2017-05-13 DIAGNOSIS — R1310 Dysphagia, unspecified: Secondary | ICD-10-CM

## 2017-05-20 ENCOUNTER — Ambulatory Visit (HOSPITAL_COMMUNITY)
Admission: RE | Admit: 2017-05-20 | Discharge: 2017-05-20 | Disposition: A | Discharge status: Home / Self Care | Payer: BLUE CROSS/BLUE SHIELD | Source: Ambulatory Visit | Attending: General Surgery | Admitting: General Surgery

## 2017-05-20 DIAGNOSIS — Q396 Congenital diverticulum of esophagus: Secondary | ICD-10-CM | POA: Diagnosis not present

## 2017-05-20 DIAGNOSIS — R1310 Dysphagia, unspecified: Secondary | ICD-10-CM | POA: Diagnosis present

## 2017-08-19 ENCOUNTER — Other Ambulatory Visit (HOSPITAL_COMMUNITY): Payer: Self-pay | Admitting: *Deleted

## 2017-08-22 ENCOUNTER — Ambulatory Visit (HOSPITAL_COMMUNITY)
Admission: RE | Admit: 2017-08-22 | Discharge: 2017-08-22 | Disposition: A | Discharge status: Home / Self Care | Payer: BLUE CROSS/BLUE SHIELD | Source: Ambulatory Visit | Attending: Obstetrics and Gynecology | Admitting: Obstetrics and Gynecology

## 2017-08-22 DIAGNOSIS — D649 Anemia, unspecified: Secondary | ICD-10-CM | POA: Diagnosis present

## 2017-08-22 MED ORDER — SODIUM CHLORIDE 0.9 % IV SOLN
510.0000 mg | INTRAVENOUS | Status: DC
  Administered 2017-08-22: 510 mg via INTRAVENOUS
  Filled 2017-08-22: qty 17

## 2017-08-22 NOTE — Discharge Instructions (Signed)

## 2017-08-25 ENCOUNTER — Ambulatory Visit (HOSPITAL_COMMUNITY)
Admission: RE | Admit: 2017-08-25 | Discharge: 2017-08-25 | Disposition: A | Discharge status: Home / Self Care | Payer: BLUE CROSS/BLUE SHIELD | Source: Ambulatory Visit | Attending: Obstetrics and Gynecology | Admitting: Obstetrics and Gynecology

## 2017-08-25 DIAGNOSIS — D649 Anemia, unspecified: Secondary | ICD-10-CM | POA: Diagnosis not present

## 2017-08-25 MED ORDER — SODIUM CHLORIDE 0.9 % IV SOLN
510.0000 mg | INTRAVENOUS | Status: DC
  Administered 2017-08-25: 510 mg via INTRAVENOUS
  Filled 2017-08-25: qty 17

## 2017-11-23 ENCOUNTER — Other Ambulatory Visit: Payer: Self-pay | Admitting: Endocrinology

## 2017-11-23 DIAGNOSIS — M5416 Radiculopathy, lumbar region: Secondary | ICD-10-CM

## 2017-12-02 ENCOUNTER — Other Ambulatory Visit: Payer: BLUE CROSS/BLUE SHIELD

## 2017-12-05 ENCOUNTER — Ambulatory Visit
Admission: RE | Admit: 2017-12-05 | Discharge: 2017-12-05 | Disposition: A | Discharge status: Home / Self Care | Payer: BLUE CROSS/BLUE SHIELD | Source: Ambulatory Visit | Attending: Endocrinology | Admitting: Endocrinology

## 2017-12-05 DIAGNOSIS — M5416 Radiculopathy, lumbar region: Secondary | ICD-10-CM

## 2017-12-05 MED ORDER — METHYLPREDNISOLONE ACETATE 40 MG/ML INJ SUSP (RADIOLOG
120.0000 mg | Freq: Once | INTRAMUSCULAR | Status: AC
  Administered 2017-12-05: 120 mg via EPIDURAL

## 2017-12-05 MED ORDER — IOPAMIDOL (ISOVUE-M 200) INJECTION 41%
1.0000 mL | Freq: Once | INTRAMUSCULAR | Status: AC
  Administered 2017-12-05: 1 mL via EPIDURAL

## 2019-08-27 ENCOUNTER — Other Ambulatory Visit: Payer: Self-pay | Admitting: Endocrinology

## 2019-08-27 DIAGNOSIS — M5416 Radiculopathy, lumbar region: Secondary | ICD-10-CM

## 2019-08-29 ENCOUNTER — Ambulatory Visit
Admission: RE | Admit: 2019-08-29 | Discharge: 2019-08-29 | Disposition: A | Discharge status: Home / Self Care | Payer: BLUE CROSS/BLUE SHIELD | Source: Ambulatory Visit | Attending: Endocrinology | Admitting: Endocrinology

## 2019-08-29 ENCOUNTER — Other Ambulatory Visit: Payer: Self-pay

## 2019-08-29 DIAGNOSIS — M5416 Radiculopathy, lumbar region: Secondary | ICD-10-CM

## 2019-08-29 MED ORDER — METHYLPREDNISOLONE ACETATE 40 MG/ML INJ SUSP (RADIOLOG
120.0000 mg | Freq: Once | INTRAMUSCULAR | Status: AC
  Administered 2019-08-29: 120 mg via EPIDURAL

## 2019-08-29 MED ORDER — IOPAMIDOL (ISOVUE-M 200) INJECTION 41%
1.0000 mL | Freq: Once | INTRAMUSCULAR | Status: AC
  Administered 2019-08-29: 1 mL via EPIDURAL

## 2019-08-29 NOTE — Discharge Instructions (Signed)

## 2020-02-19 ENCOUNTER — Other Ambulatory Visit: Payer: Self-pay | Admitting: Endocrinology

## 2020-02-19 DIAGNOSIS — M5416 Radiculopathy, lumbar region: Secondary | ICD-10-CM

## 2020-02-21 ENCOUNTER — Ambulatory Visit
Admission: RE | Admit: 2020-02-21 | Discharge: 2020-02-21 | Disposition: A | Discharge status: Home / Self Care | Payer: BLUE CROSS/BLUE SHIELD | Source: Ambulatory Visit | Attending: Endocrinology | Admitting: Endocrinology

## 2020-02-21 DIAGNOSIS — M5416 Radiculopathy, lumbar region: Secondary | ICD-10-CM

## 2020-02-21 MED ORDER — METHYLPREDNISOLONE ACETATE 40 MG/ML INJ SUSP (RADIOLOG
120.0000 mg | Freq: Once | INTRAMUSCULAR | Status: AC
  Administered 2020-02-21: 120 mg via EPIDURAL

## 2020-02-21 MED ORDER — IOPAMIDOL (ISOVUE-M 200) INJECTION 41%
1.0000 mL | Freq: Once | INTRAMUSCULAR | Status: AC
  Administered 2020-02-21: 1 mL via EPIDURAL

## 2020-02-21 NOTE — Discharge Instructions (Signed)

## 2021-06-01 DIAGNOSIS — E559 Vitamin D deficiency, unspecified: Secondary | ICD-10-CM | POA: Diagnosis not present

## 2021-06-01 DIAGNOSIS — I700 Atherosclerosis of aorta: Secondary | ICD-10-CM | POA: Diagnosis not present

## 2021-06-01 DIAGNOSIS — J45909 Unspecified asthma, uncomplicated: Secondary | ICD-10-CM | POA: Diagnosis not present

## 2021-06-01 DIAGNOSIS — I10 Essential (primary) hypertension: Secondary | ICD-10-CM | POA: Diagnosis not present

## 2021-06-01 DIAGNOSIS — E785 Hyperlipidemia, unspecified: Secondary | ICD-10-CM | POA: Diagnosis not present

## 2021-06-01 DIAGNOSIS — N200 Calculus of kidney: Secondary | ICD-10-CM | POA: Diagnosis not present

## 2021-06-01 DIAGNOSIS — Z9884 Bariatric surgery status: Secondary | ICD-10-CM | POA: Diagnosis not present

## 2021-06-01 DIAGNOSIS — D509 Iron deficiency anemia, unspecified: Secondary | ICD-10-CM | POA: Diagnosis not present

## 2021-06-01 DIAGNOSIS — K760 Fatty (change of) liver, not elsewhere classified: Secondary | ICD-10-CM | POA: Diagnosis not present

## 2021-06-01 DIAGNOSIS — E1159 Type 2 diabetes mellitus with other circulatory complications: Secondary | ICD-10-CM | POA: Diagnosis not present

## 2021-06-22 ENCOUNTER — Other Ambulatory Visit: Payer: Self-pay | Admitting: Endocrinology

## 2021-06-22 DIAGNOSIS — M5416 Radiculopathy, lumbar region: Secondary | ICD-10-CM

## 2021-06-25 ENCOUNTER — Ambulatory Visit
Admission: RE | Admit: 2021-06-25 | Discharge: 2021-06-25 | Disposition: A | Discharge status: Home / Self Care | Payer: 59 | Source: Ambulatory Visit | Attending: Endocrinology | Admitting: Endocrinology

## 2021-06-25 ENCOUNTER — Other Ambulatory Visit: Payer: Self-pay

## 2021-06-25 DIAGNOSIS — M47817 Spondylosis without myelopathy or radiculopathy, lumbosacral region: Secondary | ICD-10-CM | POA: Diagnosis not present

## 2021-06-25 DIAGNOSIS — M5416 Radiculopathy, lumbar region: Secondary | ICD-10-CM

## 2021-06-25 MED ORDER — METHYLPREDNISOLONE ACETATE 40 MG/ML INJ SUSP (RADIOLOG
80.0000 mg | Freq: Once | INTRAMUSCULAR | Status: AC
  Administered 2021-06-25: 80 mg via EPIDURAL

## 2021-06-25 MED ORDER — IOPAMIDOL (ISOVUE-M 200) INJECTION 41%
1.0000 mL | Freq: Once | INTRAMUSCULAR | Status: AC
  Administered 2021-06-25: 1 mL via EPIDURAL

## 2021-06-25 NOTE — Discharge Instructions (Signed)

## 2021-09-17 DIAGNOSIS — I10 Essential (primary) hypertension: Secondary | ICD-10-CM | POA: Diagnosis not present

## 2021-09-17 DIAGNOSIS — E785 Hyperlipidemia, unspecified: Secondary | ICD-10-CM | POA: Diagnosis not present

## 2021-09-17 DIAGNOSIS — E118 Type 2 diabetes mellitus with unspecified complications: Secondary | ICD-10-CM | POA: Diagnosis not present

## 2021-09-17 DIAGNOSIS — K219 Gastro-esophageal reflux disease without esophagitis: Secondary | ICD-10-CM | POA: Diagnosis not present

## 2021-09-17 DIAGNOSIS — N390 Urinary tract infection, site not specified: Secondary | ICD-10-CM | POA: Diagnosis not present

## 2021-09-22 DIAGNOSIS — M25531 Pain in right wrist: Secondary | ICD-10-CM | POA: Diagnosis not present

## 2021-09-22 DIAGNOSIS — M25561 Pain in right knee: Secondary | ICD-10-CM | POA: Diagnosis not present

## 2021-10-13 DIAGNOSIS — E1159 Type 2 diabetes mellitus with other circulatory complications: Secondary | ICD-10-CM | POA: Diagnosis not present

## 2021-10-13 DIAGNOSIS — I10 Essential (primary) hypertension: Secondary | ICD-10-CM | POA: Diagnosis not present

## 2021-10-13 DIAGNOSIS — E785 Hyperlipidemia, unspecified: Secondary | ICD-10-CM | POA: Diagnosis not present

## 2021-10-22 DIAGNOSIS — Z9884 Bariatric surgery status: Secondary | ICD-10-CM | POA: Diagnosis not present

## 2021-10-22 DIAGNOSIS — M545 Low back pain, unspecified: Secondary | ICD-10-CM | POA: Diagnosis not present

## 2021-10-22 DIAGNOSIS — R69 Illness, unspecified: Secondary | ICD-10-CM | POA: Diagnosis not present

## 2021-10-22 DIAGNOSIS — G4700 Insomnia, unspecified: Secondary | ICD-10-CM | POA: Diagnosis not present

## 2021-10-22 DIAGNOSIS — K760 Fatty (change of) liver, not elsewhere classified: Secondary | ICD-10-CM | POA: Diagnosis not present

## 2021-10-22 DIAGNOSIS — E559 Vitamin D deficiency, unspecified: Secondary | ICD-10-CM | POA: Diagnosis not present

## 2021-10-22 DIAGNOSIS — E1159 Type 2 diabetes mellitus with other circulatory complications: Secondary | ICD-10-CM | POA: Diagnosis not present

## 2021-10-22 DIAGNOSIS — I10 Essential (primary) hypertension: Secondary | ICD-10-CM | POA: Diagnosis not present

## 2021-10-22 DIAGNOSIS — E785 Hyperlipidemia, unspecified: Secondary | ICD-10-CM | POA: Diagnosis not present

## 2021-10-22 DIAGNOSIS — I700 Atherosclerosis of aorta: Secondary | ICD-10-CM | POA: Diagnosis not present

## 2021-12-23 DIAGNOSIS — E785 Hyperlipidemia, unspecified: Secondary | ICD-10-CM | POA: Diagnosis not present

## 2021-12-23 DIAGNOSIS — K760 Fatty (change of) liver, not elsewhere classified: Secondary | ICD-10-CM | POA: Diagnosis not present

## 2021-12-23 DIAGNOSIS — E1159 Type 2 diabetes mellitus with other circulatory complications: Secondary | ICD-10-CM | POA: Diagnosis not present

## 2021-12-23 DIAGNOSIS — I10 Essential (primary) hypertension: Secondary | ICD-10-CM | POA: Diagnosis not present

## 2021-12-23 DIAGNOSIS — Z9884 Bariatric surgery status: Secondary | ICD-10-CM | POA: Diagnosis not present

## 2022-02-02 DIAGNOSIS — H35722 Serous detachment of retinal pigment epithelium, left eye: Secondary | ICD-10-CM | POA: Diagnosis not present

## 2022-02-02 DIAGNOSIS — H353122 Nonexudative age-related macular degeneration, left eye, intermediate dry stage: Secondary | ICD-10-CM | POA: Diagnosis not present

## 2022-02-02 DIAGNOSIS — H35363 Drusen (degenerative) of macula, bilateral: Secondary | ICD-10-CM | POA: Diagnosis not present

## 2022-02-02 DIAGNOSIS — H3562 Retinal hemorrhage, left eye: Secondary | ICD-10-CM | POA: Diagnosis not present

## 2022-02-02 DIAGNOSIS — H35453 Secondary pigmentary degeneration, bilateral: Secondary | ICD-10-CM | POA: Diagnosis not present

## 2022-02-02 DIAGNOSIS — E113293 Type 2 diabetes mellitus with mild nonproliferative diabetic retinopathy without macular edema, bilateral: Secondary | ICD-10-CM | POA: Diagnosis not present

## 2022-02-02 DIAGNOSIS — Z961 Presence of intraocular lens: Secondary | ICD-10-CM | POA: Diagnosis not present

## 2022-02-02 DIAGNOSIS — H353111 Nonexudative age-related macular degeneration, right eye, early dry stage: Secondary | ICD-10-CM | POA: Diagnosis not present

## 2022-03-09 DIAGNOSIS — D509 Iron deficiency anemia, unspecified: Secondary | ICD-10-CM | POA: Diagnosis not present

## 2022-03-09 DIAGNOSIS — Z1212 Encounter for screening for malignant neoplasm of rectum: Secondary | ICD-10-CM | POA: Diagnosis not present

## 2022-03-12 DIAGNOSIS — D509 Iron deficiency anemia, unspecified: Secondary | ICD-10-CM | POA: Diagnosis not present

## 2022-03-12 DIAGNOSIS — R82998 Other abnormal findings in urine: Secondary | ICD-10-CM | POA: Diagnosis not present

## 2022-03-12 DIAGNOSIS — E559 Vitamin D deficiency, unspecified: Secondary | ICD-10-CM | POA: Diagnosis not present

## 2022-03-12 DIAGNOSIS — R7989 Other specified abnormal findings of blood chemistry: Secondary | ICD-10-CM | POA: Diagnosis not present

## 2022-03-12 DIAGNOSIS — I10 Essential (primary) hypertension: Secondary | ICD-10-CM | POA: Diagnosis not present

## 2022-03-12 DIAGNOSIS — E785 Hyperlipidemia, unspecified: Secondary | ICD-10-CM | POA: Diagnosis not present

## 2022-04-08 DIAGNOSIS — Z1212 Encounter for screening for malignant neoplasm of rectum: Secondary | ICD-10-CM | POA: Diagnosis not present

## 2022-04-08 DIAGNOSIS — D509 Iron deficiency anemia, unspecified: Secondary | ICD-10-CM | POA: Diagnosis not present

## 2022-05-18 DIAGNOSIS — N390 Urinary tract infection, site not specified: Secondary | ICD-10-CM | POA: Diagnosis not present

## 2022-05-18 DIAGNOSIS — E1159 Type 2 diabetes mellitus with other circulatory complications: Secondary | ICD-10-CM | POA: Diagnosis not present

## 2022-05-18 DIAGNOSIS — K760 Fatty (change of) liver, not elsewhere classified: Secondary | ICD-10-CM | POA: Diagnosis not present

## 2022-05-18 DIAGNOSIS — I10 Essential (primary) hypertension: Secondary | ICD-10-CM | POA: Diagnosis not present

## 2022-05-18 DIAGNOSIS — R300 Dysuria: Secondary | ICD-10-CM | POA: Diagnosis not present

## 2022-05-18 DIAGNOSIS — E785 Hyperlipidemia, unspecified: Secondary | ICD-10-CM | POA: Diagnosis not present

## 2022-06-22 DIAGNOSIS — R82998 Other abnormal findings in urine: Secondary | ICD-10-CM | POA: Diagnosis not present

## 2022-06-22 DIAGNOSIS — Z1231 Encounter for screening mammogram for malignant neoplasm of breast: Secondary | ICD-10-CM | POA: Diagnosis not present

## 2022-06-22 DIAGNOSIS — Z6829 Body mass index (BMI) 29.0-29.9, adult: Secondary | ICD-10-CM | POA: Diagnosis not present

## 2022-06-22 DIAGNOSIS — Z01419 Encounter for gynecological examination (general) (routine) without abnormal findings: Secondary | ICD-10-CM | POA: Diagnosis not present

## 2022-07-14 DIAGNOSIS — K760 Fatty (change of) liver, not elsewhere classified: Secondary | ICD-10-CM | POA: Diagnosis not present

## 2022-07-14 DIAGNOSIS — M545 Low back pain, unspecified: Secondary | ICD-10-CM | POA: Diagnosis not present

## 2022-07-14 DIAGNOSIS — N200 Calculus of kidney: Secondary | ICD-10-CM | POA: Diagnosis not present

## 2022-07-14 DIAGNOSIS — I10 Essential (primary) hypertension: Secondary | ICD-10-CM | POA: Diagnosis not present

## 2022-07-14 DIAGNOSIS — I700 Atherosclerosis of aorta: Secondary | ICD-10-CM | POA: Diagnosis not present

## 2022-07-14 DIAGNOSIS — J45909 Unspecified asthma, uncomplicated: Secondary | ICD-10-CM | POA: Diagnosis not present

## 2022-07-14 DIAGNOSIS — D509 Iron deficiency anemia, unspecified: Secondary | ICD-10-CM | POA: Diagnosis not present

## 2022-07-14 DIAGNOSIS — E785 Hyperlipidemia, unspecified: Secondary | ICD-10-CM | POA: Diagnosis not present

## 2022-07-14 DIAGNOSIS — F331 Major depressive disorder, recurrent, moderate: Secondary | ICD-10-CM | POA: Diagnosis not present

## 2022-07-14 DIAGNOSIS — Z9884 Bariatric surgery status: Secondary | ICD-10-CM | POA: Diagnosis not present

## 2022-07-14 DIAGNOSIS — G4700 Insomnia, unspecified: Secondary | ICD-10-CM | POA: Diagnosis not present

## 2022-07-14 DIAGNOSIS — E1159 Type 2 diabetes mellitus with other circulatory complications: Secondary | ICD-10-CM | POA: Diagnosis not present

## 2022-07-15 ENCOUNTER — Other Ambulatory Visit: Payer: Self-pay | Admitting: Endocrinology

## 2022-07-15 DIAGNOSIS — M545 Low back pain, unspecified: Secondary | ICD-10-CM

## 2022-07-23 ENCOUNTER — Ambulatory Visit
Admission: RE | Admit: 2022-07-23 | Discharge: 2022-07-23 | Disposition: A | Discharge status: Home / Self Care | Payer: 59 | Source: Ambulatory Visit | Attending: Endocrinology | Admitting: Endocrinology

## 2022-07-23 DIAGNOSIS — M545 Low back pain, unspecified: Secondary | ICD-10-CM

## 2022-07-23 DIAGNOSIS — M47817 Spondylosis without myelopathy or radiculopathy, lumbosacral region: Secondary | ICD-10-CM | POA: Diagnosis not present

## 2022-07-23 MED ORDER — METHYLPREDNISOLONE ACETATE 40 MG/ML INJ SUSP (RADIOLOG
80.0000 mg | Freq: Once | INTRAMUSCULAR | Status: AC
  Administered 2022-07-23: 80 mg via EPIDURAL

## 2022-07-23 MED ORDER — IOPAMIDOL (ISOVUE-M 200) INJECTION 41%
1.0000 mL | Freq: Once | INTRAMUSCULAR | Status: AC
  Administered 2022-07-23: 1 mL via EPIDURAL

## 2022-07-23 NOTE — Discharge Instructions (Signed)

## 2022-11-08 DIAGNOSIS — I700 Atherosclerosis of aorta: Secondary | ICD-10-CM | POA: Diagnosis not present

## 2022-11-08 DIAGNOSIS — E785 Hyperlipidemia, unspecified: Secondary | ICD-10-CM | POA: Diagnosis not present

## 2022-11-08 DIAGNOSIS — N390 Urinary tract infection, site not specified: Secondary | ICD-10-CM | POA: Diagnosis not present

## 2022-11-08 DIAGNOSIS — Z9884 Bariatric surgery status: Secondary | ICD-10-CM | POA: Diagnosis not present

## 2022-11-08 DIAGNOSIS — G4700 Insomnia, unspecified: Secondary | ICD-10-CM | POA: Diagnosis not present

## 2022-11-08 DIAGNOSIS — I10 Essential (primary) hypertension: Secondary | ICD-10-CM | POA: Diagnosis not present

## 2022-11-08 DIAGNOSIS — J45909 Unspecified asthma, uncomplicated: Secondary | ICD-10-CM | POA: Diagnosis not present

## 2022-11-08 DIAGNOSIS — F331 Major depressive disorder, recurrent, moderate: Secondary | ICD-10-CM | POA: Diagnosis not present

## 2022-11-08 DIAGNOSIS — E1159 Type 2 diabetes mellitus with other circulatory complications: Secondary | ICD-10-CM | POA: Diagnosis not present

## 2022-11-08 DIAGNOSIS — D509 Iron deficiency anemia, unspecified: Secondary | ICD-10-CM | POA: Diagnosis not present

## 2022-11-08 DIAGNOSIS — K760 Fatty (change of) liver, not elsewhere classified: Secondary | ICD-10-CM | POA: Diagnosis not present

## 2022-11-16 DIAGNOSIS — I10 Essential (primary) hypertension: Secondary | ICD-10-CM | POA: Diagnosis not present

## 2022-11-16 DIAGNOSIS — R300 Dysuria: Secondary | ICD-10-CM | POA: Diagnosis not present

## 2022-11-16 DIAGNOSIS — E1159 Type 2 diabetes mellitus with other circulatory complications: Secondary | ICD-10-CM | POA: Diagnosis not present

## 2022-11-16 DIAGNOSIS — E785 Hyperlipidemia, unspecified: Secondary | ICD-10-CM | POA: Diagnosis not present

## 2022-11-16 DIAGNOSIS — R82998 Other abnormal findings in urine: Secondary | ICD-10-CM | POA: Diagnosis not present

## 2022-11-16 DIAGNOSIS — K760 Fatty (change of) liver, not elsewhere classified: Secondary | ICD-10-CM | POA: Diagnosis not present

## 2022-11-26 DIAGNOSIS — N952 Postmenopausal atrophic vaginitis: Secondary | ICD-10-CM | POA: Diagnosis not present

## 2022-11-26 DIAGNOSIS — N390 Urinary tract infection, site not specified: Secondary | ICD-10-CM | POA: Diagnosis not present

## 2023-03-08 DIAGNOSIS — I10 Essential (primary) hypertension: Secondary | ICD-10-CM | POA: Diagnosis not present

## 2023-03-08 DIAGNOSIS — E1159 Type 2 diabetes mellitus with other circulatory complications: Secondary | ICD-10-CM | POA: Diagnosis not present

## 2023-03-08 DIAGNOSIS — E785 Hyperlipidemia, unspecified: Secondary | ICD-10-CM | POA: Diagnosis not present

## 2023-03-08 DIAGNOSIS — K760 Fatty (change of) liver, not elsewhere classified: Secondary | ICD-10-CM | POA: Diagnosis not present

## 2023-03-15 DIAGNOSIS — E559 Vitamin D deficiency, unspecified: Secondary | ICD-10-CM | POA: Diagnosis not present

## 2023-03-15 DIAGNOSIS — Z1212 Encounter for screening for malignant neoplasm of rectum: Secondary | ICD-10-CM | POA: Diagnosis not present

## 2023-03-15 DIAGNOSIS — D509 Iron deficiency anemia, unspecified: Secondary | ICD-10-CM | POA: Diagnosis not present

## 2023-03-15 DIAGNOSIS — E1159 Type 2 diabetes mellitus with other circulatory complications: Secondary | ICD-10-CM | POA: Diagnosis not present

## 2023-03-15 DIAGNOSIS — Z1389 Encounter for screening for other disorder: Secondary | ICD-10-CM | POA: Diagnosis not present

## 2023-03-22 DIAGNOSIS — F419 Anxiety disorder, unspecified: Secondary | ICD-10-CM | POA: Diagnosis not present

## 2023-03-22 DIAGNOSIS — I700 Atherosclerosis of aorta: Secondary | ICD-10-CM | POA: Diagnosis not present

## 2023-03-22 DIAGNOSIS — D509 Iron deficiency anemia, unspecified: Secondary | ICD-10-CM | POA: Diagnosis not present

## 2023-03-22 DIAGNOSIS — Z9884 Bariatric surgery status: Secondary | ICD-10-CM | POA: Diagnosis not present

## 2023-03-22 DIAGNOSIS — Z0000 Encounter for general adult medical examination without abnormal findings: Secondary | ICD-10-CM | POA: Diagnosis not present

## 2023-03-22 DIAGNOSIS — M545 Low back pain, unspecified: Secondary | ICD-10-CM | POA: Diagnosis not present

## 2023-03-22 DIAGNOSIS — G4700 Insomnia, unspecified: Secondary | ICD-10-CM | POA: Diagnosis not present

## 2023-03-22 DIAGNOSIS — K760 Fatty (change of) liver, not elsewhere classified: Secondary | ICD-10-CM | POA: Diagnosis not present

## 2023-03-22 DIAGNOSIS — F331 Major depressive disorder, recurrent, moderate: Secondary | ICD-10-CM | POA: Diagnosis not present

## 2023-03-22 DIAGNOSIS — I10 Essential (primary) hypertension: Secondary | ICD-10-CM | POA: Diagnosis not present

## 2023-03-22 DIAGNOSIS — E1159 Type 2 diabetes mellitus with other circulatory complications: Secondary | ICD-10-CM | POA: Diagnosis not present

## 2023-03-22 DIAGNOSIS — E785 Hyperlipidemia, unspecified: Secondary | ICD-10-CM | POA: Diagnosis not present

## 2023-03-23 ENCOUNTER — Other Ambulatory Visit: Payer: Self-pay | Admitting: Endocrinology

## 2023-03-23 DIAGNOSIS — M545 Low back pain, unspecified: Secondary | ICD-10-CM

## 2023-03-25 DIAGNOSIS — H353132 Nonexudative age-related macular degeneration, bilateral, intermediate dry stage: Secondary | ICD-10-CM | POA: Diagnosis not present

## 2023-03-28 DIAGNOSIS — Z1212 Encounter for screening for malignant neoplasm of rectum: Secondary | ICD-10-CM | POA: Diagnosis not present

## 2023-03-28 DIAGNOSIS — D509 Iron deficiency anemia, unspecified: Secondary | ICD-10-CM | POA: Diagnosis not present

## 2023-03-28 DIAGNOSIS — E1159 Type 2 diabetes mellitus with other circulatory complications: Secondary | ICD-10-CM | POA: Diagnosis not present

## 2023-03-28 DIAGNOSIS — Z1389 Encounter for screening for other disorder: Secondary | ICD-10-CM | POA: Diagnosis not present

## 2023-03-28 DIAGNOSIS — E559 Vitamin D deficiency, unspecified: Secondary | ICD-10-CM | POA: Diagnosis not present

## 2023-03-30 NOTE — Discharge Instructions (Signed)

## 2023-03-31 ENCOUNTER — Ambulatory Visit
Admission: RE | Admit: 2023-03-31 | Discharge: 2023-03-31 | Disposition: A | Discharge status: Home / Self Care | Payer: 59 | Source: Ambulatory Visit | Attending: Endocrinology | Admitting: Endocrinology

## 2023-03-31 DIAGNOSIS — M545 Low back pain, unspecified: Secondary | ICD-10-CM

## 2023-03-31 MED ORDER — IOPAMIDOL (ISOVUE-M 200) INJECTION 41%
1.0000 mL | Freq: Once | INTRAMUSCULAR | Status: AC
  Administered 2023-03-31: 1 mL via EPIDURAL

## 2023-03-31 MED ORDER — METHYLPREDNISOLONE ACETATE 40 MG/ML INJ SUSP (RADIOLOG
80.0000 mg | Freq: Once | INTRAMUSCULAR | Status: AC
  Administered 2023-03-31: 80 mg via EPIDURAL

## 2023-06-20 IMAGING — XA Imaging study
2 series · 2 of 2 positions shown · non-contrast
Comparison: none

CLINICAL DATA: Lumbosacral spondylosis without myelopathy. Good
response to multiple prior injections. Recurrent low back pain,
worse on the right.

[Series 1: ortho adipose · 1 of 1 slices shown (1 of 2)]
[im 1/1]
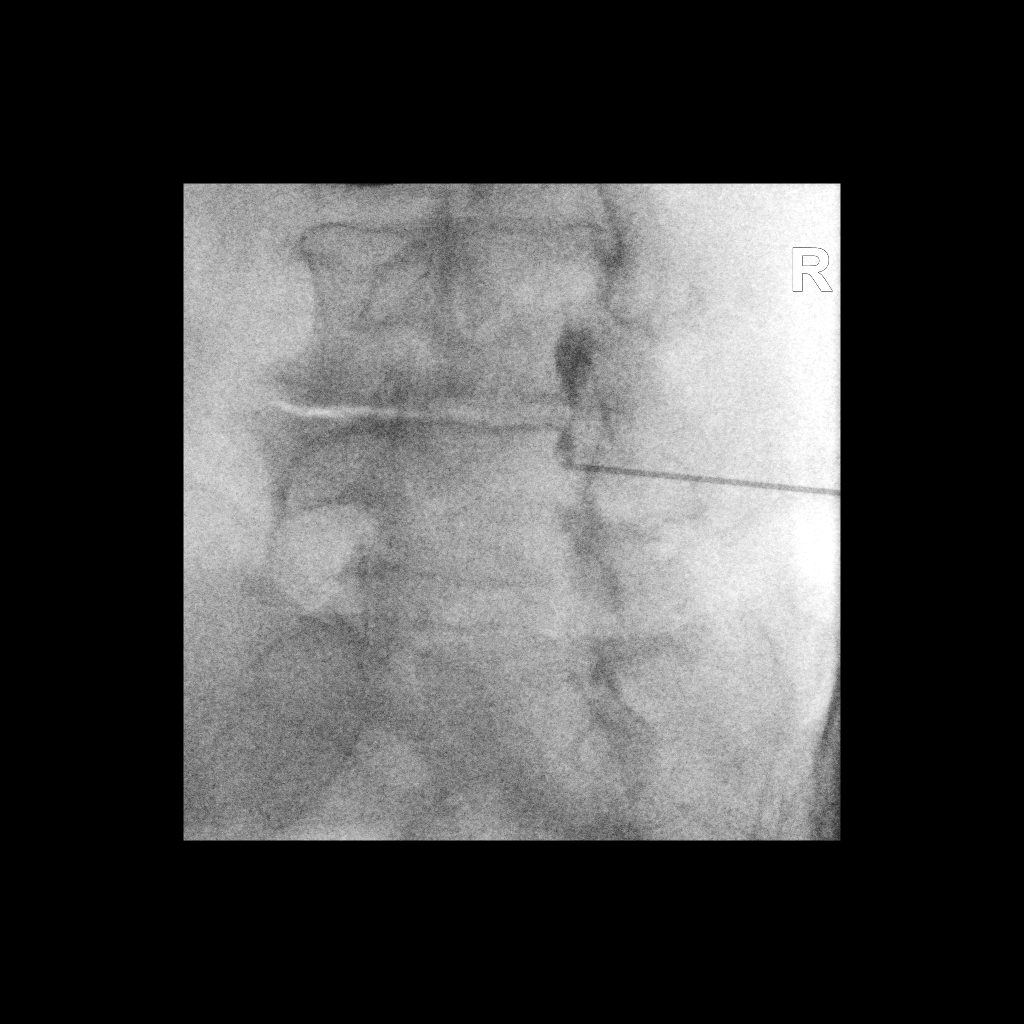

[Series 2: ortho adipose · 1 of 1 slices shown (2 of 2)]
[im 1/1]
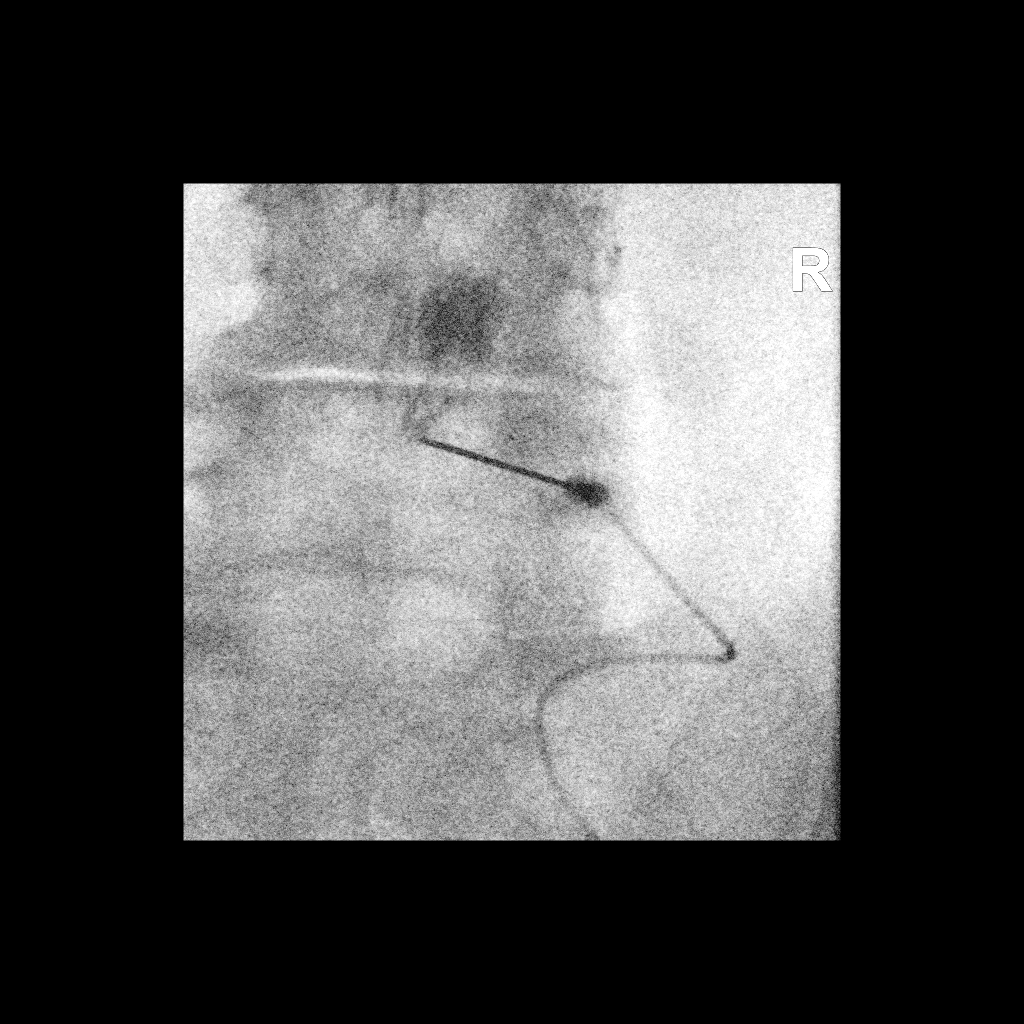

[2 of 2 positions shown; findings below may reference images not displayed]

FLUOROSCOPY:
Radiation Exposure Index (as provided by the fluoroscopic device):
2.60 mGy Kerma

PROCEDURE:
The procedure, risks, benefits, and alternatives were explained to
the patient. Questions regarding the procedure were encouraged and
answered. The patient understands and consents to the procedure.

LUMBAR EPIDURAL INJECTION:

An interlaminar approach was performed on the right at L4-5. The
overlying skin was cleansed and anesthetized. A 3.5 inch 20 gauge
epidural needle was advanced using loss-of-resistance technique.

DIAGNOSTIC EPIDURAL INJECTION:

Injection of Isovue-M 200 shows a good epidural pattern with spread
above and below the level of needle placement, primarily on the
right. No vascular opacification is seen.

THERAPEUTIC EPIDURAL INJECTION:

80 mg of Depo-Medrol mixed with 3 mL of 1% lidocaine were instilled.
The procedure was well-tolerated, and the patient was discharged
thirty minutes following the injection in good condition.

COMPLICATIONS:
None immediate
IMPRESSION: Technically successful interlaminar epidural injection on the right
at L4-5.

## 2023-06-27 DIAGNOSIS — Z1231 Encounter for screening mammogram for malignant neoplasm of breast: Secondary | ICD-10-CM | POA: Diagnosis not present

## 2023-06-27 DIAGNOSIS — Z01419 Encounter for gynecological examination (general) (routine) without abnormal findings: Secondary | ICD-10-CM | POA: Diagnosis not present

## 2023-06-27 DIAGNOSIS — Z6829 Body mass index (BMI) 29.0-29.9, adult: Secondary | ICD-10-CM | POA: Diagnosis not present

## 2023-08-02 DIAGNOSIS — D509 Iron deficiency anemia, unspecified: Secondary | ICD-10-CM | POA: Diagnosis not present

## 2023-08-02 DIAGNOSIS — I10 Essential (primary) hypertension: Secondary | ICD-10-CM | POA: Diagnosis not present

## 2023-08-02 DIAGNOSIS — Z9884 Bariatric surgery status: Secondary | ICD-10-CM | POA: Diagnosis not present

## 2023-08-02 DIAGNOSIS — J45909 Unspecified asthma, uncomplicated: Secondary | ICD-10-CM | POA: Diagnosis not present

## 2023-08-02 DIAGNOSIS — M545 Low back pain, unspecified: Secondary | ICD-10-CM | POA: Diagnosis not present

## 2023-08-02 DIAGNOSIS — F419 Anxiety disorder, unspecified: Secondary | ICD-10-CM | POA: Diagnosis not present

## 2023-08-02 DIAGNOSIS — E1159 Type 2 diabetes mellitus with other circulatory complications: Secondary | ICD-10-CM | POA: Diagnosis not present

## 2023-08-02 DIAGNOSIS — G4700 Insomnia, unspecified: Secondary | ICD-10-CM | POA: Diagnosis not present

## 2023-08-02 DIAGNOSIS — I700 Atherosclerosis of aorta: Secondary | ICD-10-CM | POA: Diagnosis not present

## 2023-08-02 DIAGNOSIS — F331 Major depressive disorder, recurrent, moderate: Secondary | ICD-10-CM | POA: Diagnosis not present

## 2023-08-02 DIAGNOSIS — K760 Fatty (change of) liver, not elsewhere classified: Secondary | ICD-10-CM | POA: Diagnosis not present

## 2023-08-02 DIAGNOSIS — E785 Hyperlipidemia, unspecified: Secondary | ICD-10-CM | POA: Diagnosis not present

## 2023-08-03 DIAGNOSIS — I10 Essential (primary) hypertension: Secondary | ICD-10-CM | POA: Diagnosis not present

## 2023-08-03 DIAGNOSIS — K760 Fatty (change of) liver, not elsewhere classified: Secondary | ICD-10-CM | POA: Diagnosis not present

## 2023-08-03 DIAGNOSIS — E1159 Type 2 diabetes mellitus with other circulatory complications: Secondary | ICD-10-CM | POA: Diagnosis not present

## 2023-08-03 DIAGNOSIS — E785 Hyperlipidemia, unspecified: Secondary | ICD-10-CM | POA: Diagnosis not present

## 2024-01-24 DIAGNOSIS — E1159 Type 2 diabetes mellitus with other circulatory complications: Secondary | ICD-10-CM | POA: Diagnosis not present

## 2024-01-24 DIAGNOSIS — E785 Hyperlipidemia, unspecified: Secondary | ICD-10-CM | POA: Diagnosis not present

## 2024-01-24 DIAGNOSIS — K760 Fatty (change of) liver, not elsewhere classified: Secondary | ICD-10-CM | POA: Diagnosis not present

## 2024-01-24 DIAGNOSIS — I10 Essential (primary) hypertension: Secondary | ICD-10-CM | POA: Diagnosis not present

## 2024-03-21 DIAGNOSIS — Z1212 Encounter for screening for malignant neoplasm of rectum: Secondary | ICD-10-CM | POA: Diagnosis not present

## 2024-03-21 DIAGNOSIS — D509 Iron deficiency anemia, unspecified: Secondary | ICD-10-CM | POA: Diagnosis not present

## 2024-03-27 DIAGNOSIS — G4700 Insomnia, unspecified: Secondary | ICD-10-CM | POA: Diagnosis not present

## 2024-03-27 DIAGNOSIS — M545 Low back pain, unspecified: Secondary | ICD-10-CM | POA: Diagnosis not present

## 2024-03-27 DIAGNOSIS — E785 Hyperlipidemia, unspecified: Secondary | ICD-10-CM | POA: Diagnosis not present

## 2024-03-27 DIAGNOSIS — I700 Atherosclerosis of aorta: Secondary | ICD-10-CM | POA: Diagnosis not present

## 2024-03-27 DIAGNOSIS — K760 Fatty (change of) liver, not elsewhere classified: Secondary | ICD-10-CM | POA: Diagnosis not present

## 2024-03-27 DIAGNOSIS — F331 Major depressive disorder, recurrent, moderate: Secondary | ICD-10-CM | POA: Diagnosis not present

## 2024-03-27 DIAGNOSIS — F419 Anxiety disorder, unspecified: Secondary | ICD-10-CM | POA: Diagnosis not present

## 2024-03-27 DIAGNOSIS — I10 Essential (primary) hypertension: Secondary | ICD-10-CM | POA: Diagnosis not present

## 2024-03-27 DIAGNOSIS — Z0000 Encounter for general adult medical examination without abnormal findings: Secondary | ICD-10-CM | POA: Diagnosis not present

## 2024-03-27 DIAGNOSIS — E1159 Type 2 diabetes mellitus with other circulatory complications: Secondary | ICD-10-CM | POA: Diagnosis not present

## 2024-03-27 DIAGNOSIS — J45909 Unspecified asthma, uncomplicated: Secondary | ICD-10-CM | POA: Diagnosis not present

## 2024-03-27 DIAGNOSIS — Z9884 Bariatric surgery status: Secondary | ICD-10-CM | POA: Diagnosis not present

## 2024-03-27 DIAGNOSIS — R82998 Other abnormal findings in urine: Secondary | ICD-10-CM | POA: Diagnosis not present

## 2024-04-20 ENCOUNTER — Other Ambulatory Visit: Payer: Self-pay | Admitting: Endocrinology

## 2024-04-20 DIAGNOSIS — M545 Low back pain, unspecified: Secondary | ICD-10-CM

## 2024-05-04 ENCOUNTER — Encounter: Payer: Self-pay | Admitting: Endocrinology

## 2024-05-08 NOTE — Discharge Instructions (Signed)

## 2024-05-09 ENCOUNTER — Inpatient Hospital Stay
Admission: RE | Admit: 2024-05-09 | Discharge: 2024-05-09 | Disposition: A | Discharge status: Home / Self Care | Source: Ambulatory Visit | Attending: Endocrinology | Admitting: Endocrinology

## 2024-05-18 NOTE — Discharge Instructions (Signed)

## 2024-05-21 ENCOUNTER — Inpatient Hospital Stay
Admission: RE | Admit: 2024-05-21 | Discharge: 2024-05-21 | Disposition: A | Source: Ambulatory Visit | Attending: Endocrinology | Admitting: Endocrinology

## 2024-05-21 DIAGNOSIS — M545 Low back pain, unspecified: Secondary | ICD-10-CM

## 2024-05-21 MED ORDER — IOPAMIDOL (ISOVUE-M 200) INJECTION 41%
1.0000 mL | Freq: Once | INTRAMUSCULAR | Status: AC
  Administered 2024-05-21: 1 mL via EPIDURAL

## 2024-05-21 MED ORDER — METHYLPREDNISOLONE ACETATE 40 MG/ML INJ SUSP (RADIOLOG
80.0000 mg | Freq: Once | INTRAMUSCULAR | Status: AC
  Administered 2024-05-21: 80 mg via EPIDURAL
